# Patient Record
Sex: Female | Born: 1945
Health system: Southern US, Community
[De-identification: ages and names within clinical notes are randomized; demographics above are authoritative.]

## PROBLEM LIST (undated history)

## (undated) DIAGNOSIS — J301 Allergic rhinitis due to pollen: Secondary | ICD-10-CM

## (undated) DIAGNOSIS — M159 Polyosteoarthritis, unspecified: Secondary | ICD-10-CM

## (undated) DIAGNOSIS — I10 Essential (primary) hypertension: Secondary | ICD-10-CM

## (undated) DIAGNOSIS — Z8249 Family history of ischemic heart disease and other diseases of the circulatory system: Secondary | ICD-10-CM

## (undated) DIAGNOSIS — M818 Other osteoporosis without current pathological fracture: Secondary | ICD-10-CM

## (undated) DIAGNOSIS — T7840XA Allergy, unspecified, initial encounter: Secondary | ICD-10-CM

## (undated) DIAGNOSIS — Z825 Family history of asthma and other chronic lower respiratory diseases: Secondary | ICD-10-CM

## (undated) DIAGNOSIS — E785 Hyperlipidemia, unspecified: Secondary | ICD-10-CM

## (undated) DIAGNOSIS — C4491 Basal cell carcinoma of skin, unspecified: Secondary | ICD-10-CM

## (undated) HISTORY — DX: Other osteoporosis without current pathological fracture: M81.8

## (undated) HISTORY — DX: Essential (primary) hypertension: I10

## (undated) HISTORY — DX: Family history of ischemic heart disease and other diseases of the circulatory system: Z82.49

## (undated) HISTORY — DX: Hyperlipidemia, unspecified: E78.5

## (undated) HISTORY — PX: TUBAL LIGATION: SHX77

## (undated) HISTORY — PX: COLONOSCOPY: SHX174

## (undated) HISTORY — DX: Basal cell carcinoma of skin, unspecified: C44.91

## (undated) HISTORY — DX: Family history of asthma and other chronic lower respiratory diseases: Z82.5

## (undated) HISTORY — PX: POLYPECTOMY: SHX149

## (undated) HISTORY — PX: AUGMENTATION MAMMAPLASTY: SUR837

## (undated) HISTORY — DX: Allergic rhinitis due to pollen: J30.1

## (undated) HISTORY — DX: Allergy, unspecified, initial encounter: T78.40XA

## (undated) HISTORY — PX: FOOT SURGERY: SHX648

## (undated) HISTORY — DX: Polyosteoarthritis, unspecified: M15.9

---

## 1968-07-31 HISTORY — PX: TONSILLECTOMY: SUR1361

## 1971-08-01 HISTORY — PX: BREAST ENHANCEMENT SURGERY: SHX7

## 1992-07-31 DIAGNOSIS — C4491 Basal cell carcinoma of skin, unspecified: Secondary | ICD-10-CM

## 1992-07-31 HISTORY — DX: Basal cell carcinoma of skin, unspecified: C44.91

## 1998-05-28 ENCOUNTER — Other Ambulatory Visit: Admission: RE | Admit: 1998-05-28 | Discharge: 1998-05-28 | Payer: Self-pay | Admitting: Internal Medicine

## 2004-05-13 ENCOUNTER — Other Ambulatory Visit: Admission: RE | Admit: 2004-05-13 | Discharge: 2004-05-13 | Payer: Self-pay | Admitting: Internal Medicine

## 2006-05-18 ENCOUNTER — Other Ambulatory Visit: Admission: RE | Admit: 2006-05-18 | Discharge: 2006-05-18 | Payer: Self-pay | Admitting: Internal Medicine

## 2007-06-26 ENCOUNTER — Encounter: Admission: RE | Admit: 2007-06-26 | Discharge: 2007-06-26 | Payer: Self-pay | Admitting: Internal Medicine

## 2007-07-03 ENCOUNTER — Encounter: Admission: RE | Admit: 2007-07-03 | Discharge: 2007-07-03 | Payer: Self-pay | Admitting: Internal Medicine

## 2008-01-03 ENCOUNTER — Encounter: Admission: RE | Admit: 2008-01-03 | Discharge: 2008-01-03 | Payer: Self-pay | Admitting: Internal Medicine

## 2008-06-26 ENCOUNTER — Encounter: Admission: RE | Admit: 2008-06-26 | Discharge: 2008-06-26 | Payer: Self-pay | Admitting: Internal Medicine

## 2009-06-10 ENCOUNTER — Other Ambulatory Visit: Admission: RE | Admit: 2009-06-10 | Discharge: 2009-06-10 | Payer: Self-pay | Admitting: Internal Medicine

## 2009-06-25 ENCOUNTER — Encounter: Admission: RE | Admit: 2009-06-25 | Discharge: 2009-06-25 | Payer: Self-pay | Admitting: Internal Medicine

## 2010-06-24 ENCOUNTER — Encounter: Admission: RE | Admit: 2010-06-24 | Discharge: 2010-06-24 | Payer: Self-pay | Admitting: Internal Medicine

## 2010-09-01 ENCOUNTER — Ambulatory Visit
Admission: RE | Admit: 2010-09-01 | Discharge: 2010-09-01 | Disposition: A | Discharge status: Home / Self Care | Payer: BC Managed Care – PPO | Source: Ambulatory Visit | Attending: Internal Medicine | Admitting: Internal Medicine

## 2010-09-01 ENCOUNTER — Other Ambulatory Visit: Payer: Self-pay | Admitting: Internal Medicine

## 2010-09-01 DIAGNOSIS — R609 Edema, unspecified: Secondary | ICD-10-CM

## 2010-09-01 DIAGNOSIS — R52 Pain, unspecified: Secondary | ICD-10-CM

## 2011-06-14 ENCOUNTER — Other Ambulatory Visit: Payer: Self-pay | Admitting: Internal Medicine

## 2011-06-14 DIAGNOSIS — Z1231 Encounter for screening mammogram for malignant neoplasm of breast: Secondary | ICD-10-CM

## 2011-07-14 ENCOUNTER — Ambulatory Visit
Admission: RE | Admit: 2011-07-14 | Discharge: 2011-07-14 | Disposition: A | Discharge status: Home / Self Care | Payer: BC Managed Care – PPO | Source: Ambulatory Visit | Attending: Internal Medicine | Admitting: Internal Medicine

## 2011-07-14 DIAGNOSIS — Z1231 Encounter for screening mammogram for malignant neoplasm of breast: Secondary | ICD-10-CM

## 2011-11-06 ENCOUNTER — Other Ambulatory Visit (HOSPITAL_COMMUNITY)
Admission: RE | Admit: 2011-11-06 | Discharge: 2011-11-06 | Disposition: A | Discharge status: Home / Self Care | Payer: BC Managed Care – PPO | Source: Ambulatory Visit | Attending: Internal Medicine | Admitting: Internal Medicine

## 2011-11-06 ENCOUNTER — Other Ambulatory Visit: Payer: Self-pay | Admitting: *Deleted

## 2011-11-06 DIAGNOSIS — Z01419 Encounter for gynecological examination (general) (routine) without abnormal findings: Secondary | ICD-10-CM | POA: Insufficient documentation

## 2011-11-06 DIAGNOSIS — Z1159 Encounter for screening for other viral diseases: Secondary | ICD-10-CM | POA: Insufficient documentation

## 2012-06-10 ENCOUNTER — Other Ambulatory Visit: Payer: Self-pay | Admitting: Internal Medicine

## 2012-07-31 LAB — HM PAP SMEAR: HM PAP: NORMAL

## 2012-08-09 ENCOUNTER — Other Ambulatory Visit: Payer: Self-pay | Admitting: Internal Medicine

## 2012-08-09 DIAGNOSIS — Z1231 Encounter for screening mammogram for malignant neoplasm of breast: Secondary | ICD-10-CM

## 2012-08-21 ENCOUNTER — Ambulatory Visit: Payer: BC Managed Care – PPO | Admitting: Internal Medicine

## 2012-08-28 ENCOUNTER — Ambulatory Visit: Payer: BC Managed Care – PPO | Admitting: Internal Medicine

## 2012-09-04 ENCOUNTER — Ambulatory Visit
Admission: RE | Admit: 2012-09-04 | Discharge: 2012-09-04 | Disposition: A | Discharge status: Home / Self Care | Payer: BC Managed Care – PPO | Source: Ambulatory Visit | Attending: Internal Medicine | Admitting: Internal Medicine

## 2012-09-04 DIAGNOSIS — Z1231 Encounter for screening mammogram for malignant neoplasm of breast: Secondary | ICD-10-CM

## 2012-09-13 ENCOUNTER — Ambulatory Visit: Payer: BC Managed Care – PPO | Admitting: Internal Medicine

## 2012-10-21 ENCOUNTER — Ambulatory Visit (INDEPENDENT_AMBULATORY_CARE_PROVIDER_SITE_OTHER): Payer: BC Managed Care – PPO | Admitting: Internal Medicine

## 2012-10-21 ENCOUNTER — Encounter: Payer: Self-pay | Admitting: Internal Medicine

## 2012-10-21 VITALS — BP 118/70 | HR 98 | Temp 98.1°F | Ht 64.0 in | Wt 146.0 lb

## 2012-10-21 DIAGNOSIS — M159 Polyosteoarthritis, unspecified: Secondary | ICD-10-CM | POA: Insufficient documentation

## 2012-10-21 DIAGNOSIS — J301 Allergic rhinitis due to pollen: Secondary | ICD-10-CM | POA: Insufficient documentation

## 2012-10-21 DIAGNOSIS — M810 Age-related osteoporosis without current pathological fracture: Secondary | ICD-10-CM | POA: Insufficient documentation

## 2012-10-21 DIAGNOSIS — I10 Essential (primary) hypertension: Secondary | ICD-10-CM | POA: Insufficient documentation

## 2012-10-21 DIAGNOSIS — E785 Hyperlipidemia, unspecified: Secondary | ICD-10-CM | POA: Insufficient documentation

## 2012-10-21 DIAGNOSIS — M818 Other osteoporosis without current pathological fracture: Secondary | ICD-10-CM

## 2012-10-21 MED ORDER — MONTELUKAST SODIUM 10 MG PO TABS: 10.0000 mg | ORAL_TABLET | Freq: Every day | ORAL | Status: DC

## 2012-10-21 NOTE — Assessment & Plan Note (Signed)
BP Readings from Last 3 Encounters:  10/21/12 118/70   Good control Recent labs normal

## 2012-10-21 NOTE — Assessment & Plan Note (Signed)
Not sure about this Will continue just the vitamin D (has trouble with the calcium) Consider repeat DEXA in a year (last 2012)

## 2012-10-21 NOTE — Assessment & Plan Note (Signed)
Various spots Especially in feet though now better Uses aleve prn

## 2012-10-21 NOTE — Assessment & Plan Note (Signed)
Has had cough and wheezing Sounds like allergic asthma Will try montelukast

## 2012-10-21 NOTE — Assessment & Plan Note (Signed)
Last non HDL chol in 70's Will consider changing to plain simvastatin

## 2012-10-21 NOTE — Progress Notes (Signed)
Subjective:    Patient ID: Belinda Perry, female    DOB: 1946/03/07, 67 y.o.   MRN: [id number]  HPI Transferring care from [facility] I see her husband and she lives here  Had recent PE last month--including breast exam  High blood pressure started in her 40's or before Satisfied with her current medication No headaches Tries to walk regularly and has some gym equipment  High cholesterol for many years also Doing well on the med No myalgias  Had calcium deposit in toe Afraid to take all the calcium due to this Then got pus out of it Saw Dr Al Corpus about this--apparently had calcium deposits (but not clear whether this was heterotopic)  Diagnosed with early osteoporosis by bone density  Some degree of arthritis Back and hands and toe? Uses aleve prn  Allergies to pollen Had bad spell last March----couldn't get rid of cough Diagnosed with "walking pneumonia" then Zyrtec no help then. Allegra not clearly helpful either---has for prn  No current outpatient prescriptions on file prior to visit.   No current facility-administered medications on file prior to visit.    Allergies  Allergen Reactions  . Codeine Rash    Past Medical History  Diagnosis Date  . Hypertension   . Hyperlipidemia   . Other osteoporosis   . Allergic reaction to inhaled pollen   . Osteoarthritis, generalized     Past Surgical History  Procedure Laterality Date  . Foot surgery Bilateral     right 8/12, 11/12 for left. Dr Al Corpus  . Tubal ligation    . Tonsillectomy N/A 1970  . Breast enhancement surgery Bilateral 1973    Family History  Problem Relation Age of Onset  . Heart disease Mother   . Heart disease Father   . Diabetes Father   . Hypertension Father   . Stroke Sister   . Heart disease Brother   . Cancer Maternal Grandfather     colon  . Cancer Other     various cancers    History   Social History  . Marital Status: Married    Spouse Name: N/A    Number of Children: 1  .  Years of Education: N/A   Occupational History  . 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 Smoking status: Never Smoker   . Smokeless tobacco: Never Used  . Alcohol Use: No  . Drug Use: No  . Sexually Active: Not on file   Other Topics Concern  . Not on file   Social History Narrative   1 daughter   Review of Systems  Constitutional: Negative for fatigue and unexpected weight change.  HENT: Positive for dental problem. Negative for hearing loss.        Trouble with her crowns now  Eyes: Negative for redness and visual disturbance.  Respiratory: Positive for cough. Negative for chest tightness and shortness of breath.        Still some cough and wheezing--relates to allergies  Cardiovascular: Negative for chest pain, palpitations and leg swelling.  Gastrointestinal: Positive for constipation and abdominal distention. Negative for nausea and vomiting.       Rare heartburn  Bloats easily---plans to try colace  Genitourinary: Negative for dysuria, difficulty urinating and dyspareunia.  Musculoskeletal: Positive for back pain and arthralgias. Negative for joint swelling.  Skin: Negative for pallor and rash.  Allergic/Immunologic: Positive for environmental allergies. Negative for immunocompromised state.  Neurological: Negative for dizziness, syncope, light-headedness and headaches.  Hematological: Negative for adenopathy. Bruises/bleeds easily.  Psychiatric/Behavioral: Positive for sleep disturbance. Negative for dysphoric mood. The patient is not nervous/anxious.        Gets up frequently since foot surgery---eventually gets back to sleep. Occasionally naps No sig daytime somnolence      Objective:   Physical Exam  Constitutional: She appears well-developed and well-nourished. No distress.  HENT:  Mouth/Throat: Oropharynx is clear and moist. No oropharyngeal exudate.  Eyes: Conjunctivae and EOM are normal. Pupils are equal, round, and  reactive to light.  Neck: Normal range of motion. Neck supple. No thyromegaly present.  Cardiovascular: Normal rate, regular rhythm, normal heart sounds and intact distal pulses.  Exam reveals no gallop.   No murmur heard. Pulmonary/Chest: Effort normal and breath sounds normal. No respiratory distress. She has no wheezes. She has no rales.  Abdominal: Soft. There is no tenderness.  Musculoskeletal: She exhibits no edema and no tenderness.  Lymphadenopathy:    She has no cervical adenopathy.  Skin: No rash noted. No erythema.  Psychiatric: She has a normal mood and affect. Her behavior is normal.          Assessment & Plan:

## 2012-11-01 ENCOUNTER — Encounter: Payer: Self-pay | Admitting: Internal Medicine

## 2013-04-08 ENCOUNTER — Other Ambulatory Visit: Payer: Self-pay

## 2013-04-08 MED ORDER — EZETIMIBE-SIMVASTATIN 10-40 MG PO TABS: 1.0000 | ORAL_TABLET | Freq: Every day | ORAL | Status: DC

## 2013-04-08 MED ORDER — LISINOPRIL-HYDROCHLOROTHIAZIDE 20-12.5 MG PO TABS: 1.0000 | ORAL_TABLET | Freq: Every day | ORAL | Status: DC

## 2013-04-08 NOTE — Telephone Encounter (Signed)
rx sent to pharmacy by e-script Spoke with patient and advised results   

## 2013-04-08 NOTE — Telephone Encounter (Signed)
Pt has changed insurance co and pt request written rx for vytorin 10/40 mg taking one daily and Lisinopril 20/12.5 mg taking one daily. Pt has f/u appt scheduled 04/25/13 but will run out of med before appt. Call pt when rx ready for pick up.

## 2013-04-22 ENCOUNTER — Telehealth: Payer: Self-pay | Admitting: *Deleted

## 2013-04-22 NOTE — Telephone Encounter (Signed)
Form on your desk for prior auth for Vytorin 10-40 take 1 tablet by mouth at bedtime.

## 2013-04-23 NOTE — Telephone Encounter (Signed)
Pt states she has an appt on Friday and will discuss then

## 2013-04-23 NOTE — Telephone Encounter (Signed)
Please call her I don't recommend using the combination medication she has since the ezitimbe part has not been proven to reduce heart problems. The new national cholesterol guidelines do not recommend using any meds besides the statins.  I would recommend changing to only the simvastatin portion (40mg  daily) and this doesn't require prior authorizaton. See if she is willing to do this

## 2013-04-23 NOTE — Telephone Encounter (Signed)
That sounds good

## 2013-04-25 ENCOUNTER — Encounter: Payer: Self-pay | Admitting: Internal Medicine

## 2013-04-25 ENCOUNTER — Ambulatory Visit (INDEPENDENT_AMBULATORY_CARE_PROVIDER_SITE_OTHER): Payer: BC Managed Care – PPO | Admitting: Internal Medicine

## 2013-04-25 VITALS — BP 140/82 | HR 91 | Temp 97.4°F | Wt 147.0 lb

## 2013-04-25 DIAGNOSIS — I10 Essential (primary) hypertension: Secondary | ICD-10-CM

## 2013-04-25 DIAGNOSIS — E785 Hyperlipidemia, unspecified: Secondary | ICD-10-CM

## 2013-04-25 DIAGNOSIS — Z23 Encounter for immunization: Secondary | ICD-10-CM

## 2013-04-25 MED ORDER — LISINOPRIL-HYDROCHLOROTHIAZIDE 20-12.5 MG PO TABS: 1.0000 | ORAL_TABLET | Freq: Every day | ORAL | Status: DC

## 2013-04-25 MED ORDER — MONTELUKAST SODIUM 10 MG PO TABS: 10.0000 mg | ORAL_TABLET | Freq: Every day | ORAL | Status: DC

## 2013-04-25 MED ORDER — SIMVASTATIN 40 MG PO TABS: 40.0000 mg | ORAL_TABLET | Freq: Every day | ORAL | Status: DC

## 2013-04-25 NOTE — Assessment & Plan Note (Signed)
BP Readings from Last 3 Encounters:  04/25/13 140/82  10/21/12 118/70   Reasonable control No changes

## 2013-04-25 NOTE — Progress Notes (Signed)
Subjective:    Patient ID: Belinda Perry, female    DOB: Feb 15, 1946, 67 y.o.   MRN: [id number]  HPI Doing well No new concerns  Discussed the cholesterol Rx Will stop the ezetimibe as the evidence base doesn't support it  No headaches No chest pain No SOB  Some allergy symptoms Has been better since using the montelukast  Current Outpatient Prescriptions on File Prior to Visit  Medication Sig Dispense Refill  . aspirin 81 MG tablet Take 81 mg by mouth daily.      Marland Kitchen ezetimibe-simvastatin (VYTORIN) 10-40 MG per tablet Take 1 tablet by mouth at bedtime.  90 tablet  0  . lisinopril-hydrochlorothiazide (PRINZIDE,ZESTORETIC) 20-12.5 MG per tablet Take 1 tablet by mouth daily.  90 tablet  0  . montelukast (SINGULAIR) 10 MG tablet Take 1 tablet (10 mg total) by mouth at bedtime.  30 tablet  11  . Naproxen Sodium (ALEVE) 220 MG CAPS Take 1 capsule by mouth daily.      . OMEGA 3 1200 MG CAPS Take 2 capsules by mouth daily.      . sennosides-docusate sodium (SENOKOT-S) 8.6-50 MG tablet Take 2 tablets by mouth daily as needed for constipation.      . Vitamin D, Ergocalciferol, (DRISDOL) 50000 UNITS CAPS Take 50,000 Units by mouth every 30 (thirty) days.       No current facility-administered medications on file prior to visit.    Allergies  Allergen Reactions  . Codeine Rash    Past Medical History  Diagnosis Date  . Hypertension   . Hyperlipidemia   . Other osteoporosis   . Allergic reaction to inhaled pollen   . Osteoarthritis, generalized     Past Surgical History  Procedure Laterality Date  . Foot surgery Bilateral     right 8/12, 11/12 for left. Dr Al Corpus  . Tubal ligation    . Tonsillectomy N/A 1970  . Breast enhancement surgery Bilateral 1973    Family History  Problem Relation Age of Onset  . Heart disease Mother   . Heart disease Father   . Diabetes Father   . Hypertension Father   . Stroke Sister   . Heart disease Brother   . Cancer Maternal Grandfather     colon  . Cancer Other     various cancers    History   Social History  . Marital Status: Married    Spouse Name: N/A    Number of Children: 1  . Years of Education: N/A   Occupational History  . 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 Smoking status: Never Smoker   . Smokeless tobacco: Never Used  . Alcohol Use: No  . Drug Use: No  . Sexual Activity: Not on file   Other Topics Concern  . Not on file   Social History Narrative   1 daughter    Review of Systems Sleeps well Appetite is good Weight stable    Objective:   Physical Exam  Constitutional: She appears well-developed and well-nourished. No distress.  Neck: Normal range of motion. Neck supple. No thyromegaly present.  Cardiovascular: Normal rate, regular rhythm and normal heart sounds.  Exam reveals no gallop.   No murmur heard. Pulmonary/Chest: Effort normal and breath sounds normal. No respiratory distress. She has no wheezes. She has no rales.  Musculoskeletal: She exhibits no edema and no tenderness.  Lymphadenopathy:    She has no cervical adenopathy.  Psychiatric: She has a  normal mood and affect.          Assessment & Plan:

## 2013-04-25 NOTE — Assessment & Plan Note (Signed)
Will change to just the simvastatin

## 2013-04-28 NOTE — Addendum Note (Signed)
Addended by: Sueanne Margarita on: 04/28/2013 12:42 PM   Modules accepted: Orders

## 2013-05-21 ENCOUNTER — Other Ambulatory Visit: Payer: Self-pay | Admitting: *Deleted

## 2013-05-21 MED ORDER — SIMVASTATIN 40 MG PO TABS: 40.0000 mg | ORAL_TABLET | Freq: Every day | ORAL | Status: DC

## 2013-06-02 ENCOUNTER — Other Ambulatory Visit: Payer: Self-pay | Admitting: Internal Medicine

## 2013-06-17 ENCOUNTER — Other Ambulatory Visit: Payer: Self-pay | Admitting: Internal Medicine

## 2013-07-01 ENCOUNTER — Other Ambulatory Visit: Payer: Self-pay | Admitting: Internal Medicine

## 2013-07-15 ENCOUNTER — Other Ambulatory Visit: Payer: Self-pay | Admitting: Internal Medicine

## 2013-08-05 ENCOUNTER — Other Ambulatory Visit: Payer: Self-pay

## 2013-08-05 DIAGNOSIS — Z1231 Encounter for screening mammogram for malignant neoplasm of breast: Secondary | ICD-10-CM

## 2013-09-05 ENCOUNTER — Ambulatory Visit
Admission: RE | Admit: 2013-09-05 | Discharge: 2013-09-05 | Disposition: A | Discharge status: Home / Self Care | Payer: Medicare HMO | Source: Ambulatory Visit

## 2013-09-05 DIAGNOSIS — Z1231 Encounter for screening mammogram for malignant neoplasm of breast: Secondary | ICD-10-CM

## 2013-09-09 ENCOUNTER — Encounter: Payer: Self-pay | Admitting: Family Medicine

## 2013-11-19 ENCOUNTER — Telehealth: Payer: Self-pay | Admitting: Internal Medicine

## 2013-11-19 ENCOUNTER — Ambulatory Visit (INDEPENDENT_AMBULATORY_CARE_PROVIDER_SITE_OTHER): Payer: Medicare HMO | Admitting: Internal Medicine

## 2013-11-19 ENCOUNTER — Encounter: Payer: Self-pay | Admitting: Internal Medicine

## 2013-11-19 VITALS — BP 110/70 | HR 74 | Temp 98.5°F | Ht 64.0 in | Wt 152.0 lb

## 2013-11-19 DIAGNOSIS — I10 Essential (primary) hypertension: Secondary | ICD-10-CM

## 2013-11-19 DIAGNOSIS — Z23 Encounter for immunization: Secondary | ICD-10-CM

## 2013-11-19 DIAGNOSIS — Z1211 Encounter for screening for malignant neoplasm of colon: Secondary | ICD-10-CM

## 2013-11-19 DIAGNOSIS — Z0000 Encounter for general adult medical examination without abnormal findings: Secondary | ICD-10-CM | POA: Insufficient documentation

## 2013-11-19 DIAGNOSIS — E785 Hyperlipidemia, unspecified: Secondary | ICD-10-CM

## 2013-11-19 LAB — CBC WITH DIFFERENTIAL/PLATELET
BASOS PCT: 0.4 % (ref 0.0–3.0)
Basophils Absolute: 0 10*3/uL (ref 0.0–0.1)
EOS ABS: 0.1 10*3/uL (ref 0.0–0.7)
Eosinophils Relative: 0.6 % (ref 0.0–5.0)
HEMATOCRIT: 47.6 % — AB (ref 36.0–46.0)
Hemoglobin: 16 g/dL — ABNORMAL HIGH (ref 12.0–15.0)
LYMPHS ABS: 2.4 10*3/uL (ref 0.7–4.0)
Lymphocytes Relative: 23.1 % (ref 12.0–46.0)
MCHC: 33.6 g/dL (ref 30.0–36.0)
MCV: 96.2 fl (ref 78.0–100.0)
MONO ABS: 0.5 10*3/uL (ref 0.1–1.0)
Monocytes Relative: 4.6 % (ref 3.0–12.0)
Neutro Abs: 7.5 10*3/uL (ref 1.4–7.7)
Neutrophils Relative %: 71.3 % (ref 43.0–77.0)
PLATELETS: 272 10*3/uL (ref 150.0–400.0)
RBC: 4.95 Mil/uL (ref 3.87–5.11)
RDW: 13.9 % (ref 11.5–14.6)
WBC: 10.6 10*3/uL — AB (ref 4.5–10.5)

## 2013-11-19 LAB — COMPREHENSIVE METABOLIC PANEL
ALBUMIN: 3.9 g/dL (ref 3.5–5.2)
ALT: 19 U/L (ref 0–35)
AST: 21 U/L (ref 0–37)
Alkaline Phosphatase: 71 U/L (ref 39–117)
BUN: 14 mg/dL (ref 6–23)
CALCIUM: 10.7 mg/dL — AB (ref 8.4–10.5)
CHLORIDE: 102 meq/L (ref 96–112)
CO2: 30 meq/L (ref 19–32)
Creatinine, Ser: 0.9 mg/dL (ref 0.4–1.2)
GFR: 65.86 mL/min (ref >60.00)
Glucose, Bld: 93 mg/dL (ref 70–99)
POTASSIUM: 4.2 meq/L (ref 3.5–5.1)
Sodium: 139 mEq/L (ref 135–145)
Total Bilirubin: 1.1 mg/dL (ref 0.3–1.2)
Total Protein: 7.4 g/dL (ref 6.0–8.3)

## 2013-11-19 LAB — LIPID PANEL
Cholesterol: 155 mg/dL (ref 0–200)
HDL: 52.6 mg/dL (ref >39.00)
LDL CALC: 81 mg/dL (ref 0–99)
Total CHOL/HDL Ratio: 3
Triglycerides: 105 mg/dL (ref 0.0–149.0)
VLDL: 21 mg/dL (ref 0.0–40.0)

## 2013-11-19 LAB — TSH: TSH: 0.69 u[IU]/mL (ref 0.35–5.50)

## 2013-11-19 LAB — T4, FREE: Free T4: 0.81 ng/dL (ref 0.60–1.60)

## 2013-11-19 NOTE — Progress Notes (Signed)
Subjective:    Patient ID: Belinda Perry, female    DOB: Feb 03, 1946, 68 y.o.   MRN: [id number]  HPI Here for physical Husband here Reviewed advanced directives Form checked Vision and hearing are fine Does exercise No falls No depression or anhedonia Reviewed immunizations and cancer screening-- needs colonoscopy Last Pap about a year ago--always normal No cognitive changes  Continues on the cholesterol med No problems with this On BP meds as well Doing well  Current Outpatient Prescriptions on File Prior to Visit  Medication Sig Dispense Refill  . aspirin 81 MG tablet Take 81 mg by mouth daily.      Marland Kitchen lisinopril-hydrochlorothiazide (PRINZIDE,ZESTORETIC) 20-12.5 MG per tablet TAKE 1 TABLET BY MOUTH DAILY.  90 tablet  0  . montelukast (SINGULAIR) 10 MG tablet Take 1 tablet (10 mg total) by mouth at bedtime.  90 tablet  3  . Naproxen Sodium (ALEVE) 220 MG CAPS Take 1 capsule by mouth daily.      . OMEGA 3 1200 MG CAPS Take 2 capsules by mouth daily.      . sennosides-docusate sodium (SENOKOT-S) 8.6-50 MG tablet Take 2 tablets by mouth daily as needed for constipation.      . simvastatin (ZOCOR) 40 MG tablet TAKE 1 TABLET (40 MG TOTAL) BY MOUTH AT BEDTIME.  90 tablet  0  . Vitamin D, Ergocalciferol, (DRISDOL) 50000 UNITS CAPS Take 50,000 Units by mouth every 30 (thirty) days.       No current facility-administered medications on file prior to visit.    Allergies  Allergen Reactions  . Codeine Rash    Past Medical History  Diagnosis Date  . Hypertension   . Hyperlipidemia   . Other osteoporosis   . Allergic reaction to inhaled pollen   . Osteoarthritis, generalized     Past Surgical History  Procedure Laterality Date  . Foot surgery Bilateral     right 8/12, 11/12 for left. Dr Milinda Pointer  . Tubal ligation    . Tonsillectomy N/A 1970  . Breast enhancement surgery Bilateral 1973    Family History  Problem Relation Age of Onset  . Heart disease Mother   . Heart  disease Father   . Diabetes Father   . Hypertension Father   . Stroke Sister   . Heart disease Brother   . Cancer Maternal Grandfather     colon  . Cancer Other     various cancers    History   Social History  . Marital Status: Married    Spouse Name: N/A    Number of Children: 1  . Years of Education: N/A   Occupational History  . 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 Smoking status: Never Smoker   . Smokeless tobacco: Never Used  . Alcohol Use: No  . Drug Use: No  . Sexual Activity: Not on file   Other Topics Concern  . Not on file   Social History Narrative   1 daughter      No living will--about to do this   Husband should be health care POA   Would accept resuscitation attempts--no prolonged ventilation   Not sure about tube feedings   Review of Systems  Constitutional: Negative for fatigue and unexpected weight change.       Wears seat belt  HENT: Negative for dental problem, hearing loss and tinnitus.        Regular with dentist  Eyes: Negative for visual  disturbance.       Due for eye recheck---Lasik in past  Respiratory: Positive for cough. Negative for chest tightness and shortness of breath.        Some chronic cough--esp at night. No need to change lisinopril (may be drainage---but not too bad)  Cardiovascular: Negative for chest pain, palpitations and leg swelling.  Gastrointestinal: Negative for nausea, vomiting, abdominal pain, constipation and blood in stool.       No heartburn  Endocrine: Negative for cold intolerance and heat intolerance.  Genitourinary: Negative for dysuria, hematuria, difficulty urinating and dyspareunia.       Mild stress incontinence  Musculoskeletal: Positive for arthralgias. Negative for back pain and joint swelling.       Some CMC and occ foot pain--rarely uses aleve  Skin: Negative for rash.       No suspicious lesions  Allergic/Immunologic: Positive for environmental allergies.  Negative for immunocompromised state.       Using cetirizine and montelukast  Neurological: Negative for dizziness, syncope, weakness, light-headedness, numbness and headaches.  Hematological: Negative for adenopathy. Bruises/bleeds easily.       Objective:   Physical Exam  Constitutional: She is oriented to person, place, and time. She appears well-developed and well-nourished. No distress.  HENT:  Head: Normocephalic and atraumatic.  Right Ear: External ear normal.  Left Ear: External ear normal.  Mouth/Throat: Oropharynx is clear and moist. No oropharyngeal exudate.  Eyes: Conjunctivae and EOM are normal. Pupils are equal, round, and reactive to light.  Neck: Normal range of motion. Neck supple. No thyromegaly present.  Cardiovascular: Normal rate, regular rhythm, normal heart sounds and intact distal pulses.  Exam reveals no gallop.   No murmur heard. Pulmonary/Chest: Effort normal and breath sounds normal. No respiratory distress. She has no wheezes. She has no rales.  Abdominal: Soft. There is no tenderness.  Genitourinary:  Implants intact No masses in breasts  Musculoskeletal: She exhibits no edema and no tenderness.  Lymphadenopathy:    She has no cervical adenopathy.    She has no axillary adenopathy.  Neurological: She is alert and oriented to person, place, and time.  Skin: No rash noted. No erythema.  Psychiatric: She has a normal mood and affect. Her behavior is normal.          Assessment & Plan:

## 2013-11-19 NOTE — Assessment & Plan Note (Signed)
BP Readings from Last 3 Encounters:  11/19/13 110/70  04/25/13 140/82  10/21/12 118/70   Good control No changes needed

## 2013-11-19 NOTE — Progress Notes (Signed)
Pre visit review using our clinic review tool, if applicable. No additional management support is needed unless otherwise documented below in the visit note. 

## 2013-11-19 NOTE — Telephone Encounter (Signed)
Relevant patient education assigned to patient using Emmi. ° °

## 2013-11-19 NOTE — Assessment & Plan Note (Signed)
Healthy Will give pneumovax Colonoscopy this year

## 2013-11-19 NOTE — Assessment & Plan Note (Signed)
Comfortable with primary prevention 

## 2013-11-19 NOTE — Addendum Note (Signed)
Addended by: Despina Hidden on: 11/19/2013 03:03 PM   Modules accepted: Orders

## 2013-11-21 ENCOUNTER — Encounter: Payer: Self-pay | Admitting: *Deleted

## 2013-12-04 ENCOUNTER — Other Ambulatory Visit: Payer: Self-pay | Admitting: Internal Medicine

## 2013-12-25 ENCOUNTER — Ambulatory Visit (AMBULATORY_SURGERY_CENTER): Payer: Self-pay | Admitting: *Deleted

## 2013-12-25 VITALS — Ht 64.0 in | Wt 151.2 lb

## 2013-12-25 DIAGNOSIS — Z1211 Encounter for screening for malignant neoplasm of colon: Secondary | ICD-10-CM

## 2013-12-25 MED ORDER — MOVIPREP 100 G PO SOLR: ORAL | Status: DC

## 2013-12-25 NOTE — Progress Notes (Signed)
No allergies to eggs or soy. No problems with anesthesia.  Pt given Emmi instructions for colonoscopy  No oxygen use  No diet drug use  

## 2014-01-08 ENCOUNTER — Encounter: Payer: Self-pay | Admitting: Internal Medicine

## 2014-01-08 ENCOUNTER — Ambulatory Visit (AMBULATORY_SURGERY_CENTER): Payer: Medicare HMO | Admitting: Internal Medicine

## 2014-01-08 ENCOUNTER — Other Ambulatory Visit: Payer: Self-pay | Admitting: Internal Medicine

## 2014-01-08 VITALS — BP 121/75 | HR 73 | Temp 97.3°F | Resp 24 | Ht 64.0 in | Wt 151.0 lb

## 2014-01-08 DIAGNOSIS — D126 Benign neoplasm of colon, unspecified: Secondary | ICD-10-CM

## 2014-01-08 DIAGNOSIS — Z1211 Encounter for screening for malignant neoplasm of colon: Secondary | ICD-10-CM

## 2014-01-08 DIAGNOSIS — D128 Benign neoplasm of rectum: Secondary | ICD-10-CM

## 2014-01-08 DIAGNOSIS — D129 Benign neoplasm of anus and anal canal: Secondary | ICD-10-CM

## 2014-01-08 MED ORDER — SODIUM CHLORIDE 0.9 % IV SOLN: 500.0000 mL | INTRAVENOUS | Status: DC

## 2014-01-08 NOTE — Patient Instructions (Signed)
Discharge instructions given with verbal understanding. Handouts on polyps and diverticulosis. Hold aspirin and aspirin products for 1 week. Resume previous medications. YOU HAD AN ENDOSCOPIC PROCEDURE TODAY AT [facility]: Refer to the procedure report that was given to you for any specific questions about what was found during the examination.  If the procedure report does not answer your questions, please call your gastroenterologist to clarify.  If you requested that your care partner not be given the details of your procedure findings, then the procedure report has been included in a sealed envelope for you to review at your convenience later.  YOU SHOULD EXPECT: Some feelings of bloating in the abdomen. Passage of more gas than usual.  Walking can help get rid of the air that was put into your GI tract during the procedure and reduce the bloating. If you had a lower endoscopy (such as a colonoscopy or flexible sigmoidoscopy) you may notice spotting of blood in your stool or on the toilet paper. If you underwent a bowel prep for your procedure, then you may not have a normal bowel movement for a few days.  DIET: Your first meal following the procedure should be a light meal and then it is ok to progress to your normal diet.  A half-sandwich or bowl of soup is an example of a good first meal.  Heavy or fried foods are harder to digest and may make you feel nauseous or bloated.  Likewise meals heavy in dairy and vegetables can cause extra gas to form and this can also increase the bloating.  Drink plenty of fluids but you should avoid alcoholic beverages for 24 hours.  ACTIVITY: Your care partner should take you home directly after the procedure.  You should plan to take it easy, moving slowly for the rest of the day.  You can resume normal activity the day after the procedure however you should NOT DRIVE or use heavy machinery for 24 hours (because of the sedation medicines used during  the test).    SYMPTOMS TO REPORT IMMEDIATELY: A gastroenterologist can be reached at any hour.  During normal business hours, 8:30 AM to 5:00 PM Monday through Friday, call [phone].  After hours and on weekends, please call the GI answering service at [phone] who will take a message and have the physician on call contact you.   Following lower endoscopy (colonoscopy or flexible sigmoidoscopy):  Excessive amounts of blood in the stool  Significant tenderness or worsening of abdominal pains  Swelling of the abdomen that is new, acute  Fever of 100F or higher  FOLLOW UP: If any biopsies were taken you will be contacted by phone or by letter within the next 1-3 weeks.  Call your gastroenterologist if you have not heard about the biopsies in 3 weeks.  Our staff will call the home number listed on your records the next business day following your procedure to check on you and address any questions or concerns that you may have at that time regarding the information given to you following your procedure. This is a courtesy call and so if there is no answer at the home number and we have not heard from you through the emergency physician on call, we will assume that you have returned to your regular daily activities without incident.  SIGNATURES/CONFIDENTIALITY: You and/or your care partner have signed paperwork which will be entered into your electronic medical record.  These signatures attest to the fact that that  the information above on your After Visit Summary has been reviewed and is understood.  Full responsibility of the confidentiality of this discharge information lies with you and/or your care-partner.

## 2014-01-08 NOTE — Op Note (Signed)
[place]  [address]. [place], [address]   COLONOSCOPY PROCEDURE REPORT  PATIENT: [name], [name]  MR#: [id number] BIRTHDATE: 10-20-1945 , 54  yrs. old GENDER: Female ENDOSCOPIST: Jerene Bears, MD REFERRED AC:[id number] Silvio Pate, M.D. PROCEDURE DATE:  01/08/2014 PROCEDURE:   Colonoscopy with snare polypectomy First Screening Colonoscopy - Avg.  risk and is 50 yrs.  old or older Yes.  Prior Negative Screening - Now for repeat screening. N/A  History of Adenoma - Now for follow-up colonoscopy & has been > or = to 3 yrs.  N/A  Polyps Removed Today? Yes. ASA CLASS:   Class II INDICATIONS:average risk screening and first colonoscopy. MEDICATIONS: MAC sedation, administered by CRNA and propofol (Diprivan) 500mg  IV  DESCRIPTION OF PROCEDURE:   After the risks benefits and alternatives of the procedure were thoroughly explained, informed consent was obtained.  A digital rectal exam revealed no rectal mass.   The LB [id number]  endoscope was introduced through the anus and advanced to the cecum, which was identified by both the appendix and ileocecal valve. No adverse events experienced. The quality of the prep was good, using MoviPrep  The instrument was then slowly withdrawn as the colon was fully examined.   COLON FINDINGS: Two sessile polyps measuring 8 and 3 mm in size with mucous caps were found at the cecum and hepatic flexure. Polypectomy was performed using cold snare.  All resections were complete and all polyp tissue was completely retrieved.   Ten sessile polyps ranging between 5-31mm in size were found in the descending colon (1), sigmoid colon (7), and rectum (2). Polypectomy was performed using cold snare.  All resections were complete and all polyp tissue was completely retrieved.   There was moderate diverticulosis noted in the ascending colon, descending colon, and sigmoid colon with associated tortuosity and angulation. Retroflexed views  revealed no abnormalities. The time to cecum=6 minutes 07 seconds.  Withdrawal time=18 minutes 18 seconds.  The scope was withdrawn and the procedure completed. COMPLICATIONS: There were no complications.  ENDOSCOPIC IMPRESSION: 1.   Two sessile polyps measuring 8 and 3 mm in size were found at the cecum and hepatic flexure; Polypectomy was performed using cold snare 2.   Ten sessile polyps ranging between 5-41mm in size were found in the descending colon, sigmoid colon, and rectum; Polypectomy was performed using cold snare 3.   There was moderate diverticulosis noted in the ascending colon, descending colon, and sigmoid colon  RECOMMENDATIONS: 1.  Hold aspirin, aspirin products, and anti-inflammatory medication for 1 week. 2.  Await pathology results 3.  High fiber diet 4.  Timing of repeat colonoscopy will be determined by pathology findings. 5.  You will receive a letter within 1-2 weeks with the results of your biopsy as well as final recommendations.  Please call my office if you have not received a letter after 3 weeks.   eSigned:  Jerene Bears, MD 01/08/2014 9:20 AM   cc: The Patient and Venia Carbon, MD   PATIENT NAME:  Belinda Perry, Belinda Perry MR#: [id number]

## 2014-01-08 NOTE — Progress Notes (Signed)
Called to room to assist during endoscopic procedure.  Patient ID and intended procedure confirmed with present staff. Received instructions for my participation in the procedure from the performing physician.  

## 2014-01-08 NOTE — Progress Notes (Signed)
Stable to RR 

## 2014-01-09 ENCOUNTER — Telehealth: Payer: Self-pay | Admitting: *Deleted

## 2014-01-09 NOTE — Telephone Encounter (Signed)
  Follow up Call-  No answer, left message to call if questions or concerns.    

## 2014-01-13 ENCOUNTER — Encounter: Payer: Self-pay | Admitting: Internal Medicine

## 2014-03-04 ENCOUNTER — Other Ambulatory Visit: Payer: Self-pay | Admitting: Internal Medicine

## 2014-04-07 ENCOUNTER — Other Ambulatory Visit: Payer: Self-pay | Admitting: Family Medicine

## 2014-04-07 MED ORDER — SIMVASTATIN 40 MG PO TABS
ORAL_TABLET | ORAL | Status: DC
Start: 2014-04-07 — End: 2014-12-30

## 2014-04-07 MED ORDER — MONTELUKAST SODIUM 10 MG PO TABS: ORAL_TABLET | ORAL | Status: DC

## 2014-06-03 ENCOUNTER — Ambulatory Visit (INDEPENDENT_AMBULATORY_CARE_PROVIDER_SITE_OTHER): Payer: Medicare HMO | Admitting: *Deleted

## 2014-06-03 ENCOUNTER — Ambulatory Visit: Payer: Medicare HMO

## 2014-06-03 DIAGNOSIS — Z23 Encounter for immunization: Secondary | ICD-10-CM

## 2014-06-08 ENCOUNTER — Other Ambulatory Visit: Payer: Self-pay | Admitting: Internal Medicine

## 2014-09-15 ENCOUNTER — Telehealth: Payer: Self-pay | Admitting: *Deleted

## 2014-09-15 NOTE — Telephone Encounter (Signed)
Patient notified as instructed by telephone and verbalized understanding . Was advised by patient that she was talking about allergies. Patient stated that she took Zyrtec and it did not help, but will do what Dr. Silvio Pate recommends.

## 2014-09-15 NOTE — Telephone Encounter (Signed)
Patient left a voicemail stating that she is getting ready to go on a cruise and wants to know if there any medication or injection she can take to keep from getting sick?  Patient stated that she has gotten sick previously with her allergies when taking an [place] cruise even with taking her Zyrtec and other medications. Patient request any recommendations.

## 2014-09-15 NOTE — Telephone Encounter (Signed)
If she means sea-sickness-- I recommend meclizine 25mg  three times daily till she gets used to the ship. If she means a respiratory illness, I don't have any great ideas other than meticulous repetitive hand washing.

## 2014-10-19 ENCOUNTER — Ambulatory Visit (INDEPENDENT_AMBULATORY_CARE_PROVIDER_SITE_OTHER): Payer: Medicare HMO | Admitting: Internal Medicine

## 2014-10-19 ENCOUNTER — Encounter: Payer: Self-pay | Admitting: Internal Medicine

## 2014-10-19 VITALS — BP 120/80 | HR 101 | Temp 98.0°F | Resp 16 | Wt 147.0 lb

## 2014-10-19 DIAGNOSIS — K529 Noninfective gastroenteritis and colitis, unspecified: Secondary | ICD-10-CM

## 2014-10-19 NOTE — Progress Notes (Signed)
Pre visit review using our clinic review tool, if applicable. No additional management support is needed unless otherwise documented below in the visit note. 

## 2014-10-19 NOTE — Progress Notes (Signed)
Subjective:    Patient ID: Belinda Perry, female    DOB: [birth year], 69 y.o.   MRN: [id number]  HPI Having GI problems Went on cruise and started with cough in February Had GI bug there also---was put on isolation Given "pill" for diarrhea--seemed to help some Also pneumonia on the ship also but she didn't have this Got back home slightly over 2 weeks ago  Still has problems with soda--doesn't taste right Eating very little--- ice cream or yogurt Gets indigestion post prandial--with nausea but no vomiting No diarrhea  Flu type symptoms  Aching all over, gets weak and some chills Low grade fever with episode of diarrhea 4 days ago  Has not taken any other meds for illness Using mucinex and flonase for the allergies (and montelukast)  Current Outpatient Prescriptions on File Prior to Visit  Medication Sig Dispense Refill  . aspirin 81 MG tablet Take 81 mg by mouth daily.    Marland Kitchen BIOTIN PO Take by mouth daily.    . calcium carbonate (OS-CAL) 600 MG TABS tablet Take 600 mg by mouth daily.    Marland Kitchen lisinopril-hydrochlorothiazide (PRINZIDE,ZESTORETIC) 20-12.5 MG per tablet TAKE 1 TABLET BY MOUTH DAILY. 90 tablet 1  . montelukast (SINGULAIR) 10 MG tablet TAKE 1 TABLET (10 MG TOTAL) BY MOUTH AT BEDTIME. 30 tablet 5  . Naproxen Sodium (ALEVE) 220 MG CAPS Take 1 capsule by mouth daily.    . OMEGA 3 1200 MG CAPS Take 2 capsules by mouth daily.    . sennosides-docusate sodium (SENOKOT-S) 8.6-50 MG tablet Take 2 tablets by mouth daily as needed for constipation.    . simvastatin (ZOCOR) 40 MG tablet TAKE 1 TABLET (40 MG TOTAL) BY MOUTH AT BEDTIME. 90 tablet 1   No current facility-administered medications on file prior to visit.    Allergies  Allergen Reactions  . Codeine     "makes me feel hyper"    Past Medical History  Diagnosis Date  . Hypertension   . Hyperlipidemia   . Other osteoporosis   . Allergic reaction to inhaled pollen   . Osteoarthritis, generalized   . Allergy   .  Skin cancer, basal cell 1994    face    Past Surgical History  Procedure Laterality Date  . Foot surgery Bilateral     right 8/12, 11/12 for left. Dr Milinda Pointer  . Tubal ligation    . Tonsillectomy N/A 1970  . Breast enhancement surgery Bilateral 1973    Family History  Problem Relation Age of Onset  . Heart disease Mother   . Heart disease Father   . Diabetes Father   . Hypertension Father   . Stroke Sister   . Heart disease Brother   . Cancer Maternal Grandfather     colon  . Colon cancer Maternal Grandfather 98  . Cancer Other     various cancers  . Colon cancer Paternal Uncle 6    History   Social History  . Marital Status: Married    Spouse Name: N/A  . Number of Children: 1  . Years of Education: N/A   Occupational History  . 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 Smoking status: Never Smoker   . Smokeless tobacco: Never Used  . Alcohol Use: No  . Drug Use: No  . Sexual Activity: Not on file   Other Topics Concern  . Not on file   Social History Narrative   1 daughter  No living will--about to do this   Husband should be health care POA   Would accept resuscitation attempts--no prolonged ventilation   Not sure about tube feedings   Review of Systems Has lost a few pounds since last visit No SOB No regular cough    Objective:   Physical Exam  Constitutional: She appears well-developed and well-nourished. No distress.  HENT:  Mouth/Throat: Oropharynx is clear and moist. No oropharyngeal exudate.  Neck: Normal range of motion. Neck supple. No thyromegaly present.  Pulmonary/Chest: Effort normal and breath sounds normal. No respiratory distress. She has no wheezes. She has no rales.  Abdominal: Soft. Bowel sounds are normal. She exhibits no distension. There is no tenderness. There is no rebound and no guarding.  Musculoskeletal: She exhibits no edema.  Lymphadenopathy:    She has no cervical adenopathy.           Assessment & Plan:

## 2014-10-19 NOTE — Patient Instructions (Signed)
Please try activia yogurt &/or align probiotic tabs---to help your system get back to normal.

## 2014-10-19 NOTE — Assessment & Plan Note (Signed)
Seems to have resolved but not back to normal No respiratory findings Reassured  Try probiotic

## 2014-10-31 ENCOUNTER — Other Ambulatory Visit: Payer: Self-pay | Admitting: Internal Medicine

## 2014-11-23 ENCOUNTER — Ambulatory Visit (INDEPENDENT_AMBULATORY_CARE_PROVIDER_SITE_OTHER): Payer: Medicare HMO | Admitting: Internal Medicine

## 2014-11-23 ENCOUNTER — Encounter: Payer: Self-pay | Admitting: Internal Medicine

## 2014-11-23 VITALS — BP 128/80 | HR 51 | Temp 97.5°F | Ht 64.0 in | Wt 148.0 lb

## 2014-11-23 DIAGNOSIS — I10 Essential (primary) hypertension: Secondary | ICD-10-CM

## 2014-11-23 DIAGNOSIS — E785 Hyperlipidemia, unspecified: Secondary | ICD-10-CM | POA: Diagnosis not present

## 2014-11-23 DIAGNOSIS — M810 Age-related osteoporosis without current pathological fracture: Secondary | ICD-10-CM

## 2014-11-23 DIAGNOSIS — Z0000 Encounter for general adult medical examination without abnormal findings: Secondary | ICD-10-CM

## 2014-11-23 DIAGNOSIS — J301 Allergic rhinitis due to pollen: Secondary | ICD-10-CM | POA: Diagnosis not present

## 2014-11-23 DIAGNOSIS — Z23 Encounter for immunization: Secondary | ICD-10-CM | POA: Diagnosis not present

## 2014-11-23 DIAGNOSIS — Z7189 Other specified counseling: Secondary | ICD-10-CM

## 2014-11-23 MED ORDER — LISINOPRIL-HYDROCHLOROTHIAZIDE 20-12.5 MG PO TABS: 1.0000 | ORAL_TABLET | Freq: Every day | ORAL | Status: DC

## 2014-11-23 NOTE — Assessment & Plan Note (Signed)
No problems with statin--will continue primary prevention

## 2014-11-23 NOTE — Assessment & Plan Note (Signed)
See social history 

## 2014-11-23 NOTE — Assessment & Plan Note (Signed)
Regular exercise Will continue the calcium and vitamin D unless her calcium up again

## 2014-11-23 NOTE — Progress Notes (Signed)
Subjective:    Patient ID: Belinda Perry, female    DOB: [birth year], 69 y.o.   MRN: [id number]  HPI Here for Medicare wellness and follow up of chronic medical conditions Reviewed advanced directives No other doctors--other than opto (was [place]). Dr Benard Halsted to exercise--weights and walking No hospitalizations or procedures in past year No tobacco Occasional alcohol Vision and hearing okay Independent with instrumental ADLs No apparent cognitive decline Reviewed her  No falls No depression or anhedonia  Really enjoying retirement Likes to cruise  Ongoing allergy symptoms--spring is bad Milder yearlong problems Cetirizine, flonase and montelukast  No chest pain No SOB No dizziness or syncope No edema No headaches  Still on statin No myalgias No GI problems  Regular weight bearing exercise On calcium and vitamin D  Current Outpatient Prescriptions on File Prior to Visit  Medication Sig Dispense Refill  . aspirin 81 MG tablet Take 81 mg by mouth daily.    Marland Kitchen BIOTIN PO Take by mouth daily.    . montelukast (SINGULAIR) 10 MG tablet TAKE 1 TABLET (10 MG TOTAL) BY MOUTH AT BEDTIME. 30 tablet 5  . OMEGA 3 1200 MG CAPS Take 2 capsules by mouth daily.    . simvastatin (ZOCOR) 40 MG tablet TAKE 1 TABLET (40 MG TOTAL) BY MOUTH AT BEDTIME. 90 tablet 1   No current facility-administered medications on file prior to visit.    Allergies  Allergen Reactions  . Codeine     "makes me feel hyper"    Past Medical History  Diagnosis Date  . Hypertension   . Hyperlipidemia   . Other osteoporosis   . Allergic reaction to inhaled pollen   . Osteoarthritis, generalized   . Allergy   . Skin cancer, basal cell 1994    face    Past Surgical History  Procedure Laterality Date  . Foot surgery Bilateral     right 8/12, 11/12 for left. Dr Milinda Pointer  . Tubal ligation    . Tonsillectomy N/A 1970  . Breast enhancement surgery Bilateral 1973    Family History    Problem Relation Age of Onset  . Heart disease Mother   . Heart disease Father   . Diabetes Father   . Hypertension Father   . Stroke Sister   . Heart disease Brother   . Cancer Maternal Grandfather     colon  . Colon cancer Maternal Grandfather 86  . Cancer Other     various cancers  . Colon cancer Paternal Uncle 100    History   Social History  . Marital Status: Married    Spouse Name: N/A  . Number of Children: 1  . Years of Education: N/A   Occupational History  . 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 Smoking status: Never Smoker   . Smokeless tobacco: Never Used  . Alcohol Use: No  . Drug Use: No  . Sexual Activity: Not on file   Other Topics Concern  . Not on file   Social History Narrative   1 daughter      No living will   Husband should be health care POA   Would accept resuscitation attempts--no prolonged ventilation   Not sure about tube feedings--but wouldn't want this prolonged   Review of Systems Bowels are good on the probiotic Emmaline Kluver) Appetite is good Weight did go up on vacation--has lost some back Sleeps well No sig joint pain. Back gets stiff  and tight if she sits a long time. Past tennis elbow and bursitis on right. Occasionally uses aleve    Objective:   Physical Exam  Constitutional: She is oriented to person, place, and time. She appears well-developed and well-nourished. No distress.  HENT:  Mouth/Throat: Oropharynx is clear and moist. No oropharyngeal exudate.  Neck: Normal range of motion. Neck supple. No thyromegaly present.  Cardiovascular: Normal rate, regular rhythm, normal heart sounds and intact distal pulses.  Exam reveals no gallop.   No murmur heard. Pulmonary/Chest: Effort normal and breath sounds normal. No respiratory distress. She has no wheezes. She has no rales.  Abdominal: Soft. There is no tenderness.  Musculoskeletal: She exhibits no edema or tenderness.  Lymphadenopathy:     She has no cervical adenopathy.  Neurological: She is alert and oriented to person, place, and time.  President-- "Obama, Bush, Clinton" [phone] D-l-r-o-w Recall 3/3  Skin: No rash noted. No erythema.  Psychiatric: She has a normal mood and affect. Her behavior is normal.          Assessment & Plan:

## 2014-11-23 NOTE — Addendum Note (Signed)
Addended by: Despina Hidden on: 11/23/2014 11:32 AM   Modules accepted: Orders

## 2014-11-23 NOTE — Patient Instructions (Signed)
Your mammogram is due 2/17

## 2014-11-23 NOTE — Assessment & Plan Note (Signed)
I have personally reviewed the Medicare Annual Wellness questionnaire and have noted 1. The patient's medical and social history 2. Their use of alcohol, tobacco or illicit drugs 3. Their current medications and supplements 4. The patient's functional ability including ADL's, fall risks, home safety risks and hearing or visual             impairment. 5. Diet and physical activities 6. Evidence for depression or mood disorders  The patients weight, height, BMI and visual acuity have been recorded in the chart I have made referrals, counseling and provided education to the patient based review of the above and I have provided the pt with a written personalized care plan for preventive services.  I have provided you with a copy of your personalized plan for preventive services. Please take the time to review along with your updated medication list.  Will give prevnar today Td today also (no Tdap due to price) Mammogram due 2/17--she preferred no breast exam Colonoscopy due 2020

## 2014-11-23 NOTE — Assessment & Plan Note (Signed)
Satisfied with current Rx

## 2014-11-23 NOTE — Progress Notes (Signed)
Pre visit review using our clinic review tool, if applicable. No additional management support is needed unless otherwise documented below in the visit note. 

## 2014-11-23 NOTE — Assessment & Plan Note (Signed)
BP Readings from Last 3 Encounters:  11/23/14 128/80  10/19/14 120/80  01/08/14 121/75   Good control No changes due for labs

## 2014-11-25 ENCOUNTER — Other Ambulatory Visit (INDEPENDENT_AMBULATORY_CARE_PROVIDER_SITE_OTHER): Payer: Medicare HMO

## 2014-11-25 LAB — CBC WITH DIFFERENTIAL/PLATELET
BASOS ABS: 0 10*3/uL (ref 0.0–0.1)
Basophils Relative: 0.5 % (ref 0.0–3.0)
Eosinophils Absolute: 0.1 10*3/uL (ref 0.0–0.7)
Eosinophils Relative: 1.2 % (ref 0.0–5.0)
HCT: 46.2 % — ABNORMAL HIGH (ref 36.0–46.0)
Hemoglobin: 15.5 g/dL — ABNORMAL HIGH (ref 12.0–15.0)
Lymphocytes Relative: 25.6 % (ref 12.0–46.0)
Lymphs Abs: 1.9 10*3/uL (ref 0.7–4.0)
MCHC: 33.7 g/dL (ref 30.0–36.0)
MCV: 94.4 fl (ref 78.0–100.0)
MONOS PCT: 9.8 % (ref 3.0–12.0)
Monocytes Absolute: 0.7 10*3/uL (ref 0.1–1.0)
NEUTROS PCT: 62.9 % (ref 43.0–77.0)
Neutro Abs: 4.7 10*3/uL (ref 1.4–7.7)
PLATELETS: 278 10*3/uL (ref 150.0–400.0)
RBC: 4.89 Mil/uL (ref 3.87–5.11)
RDW: 14.8 % (ref 11.5–15.5)
WBC: 7.6 10*3/uL (ref 4.0–10.5)

## 2014-11-25 LAB — LIPID PANEL
Cholesterol: 163 mg/dL (ref 0–200)
HDL: 48.1 mg/dL (ref >39.00)
LDL Cholesterol: 96 mg/dL (ref 0–99)
NONHDL: 114.9
Total CHOL/HDL Ratio: 3
Triglycerides: 94 mg/dL (ref 0.0–149.0)
VLDL: 18.8 mg/dL (ref 0.0–40.0)

## 2014-11-25 LAB — COMPREHENSIVE METABOLIC PANEL
ALT: 23 U/L (ref 0–35)
AST: 21 U/L (ref 0–37)
Albumin: 4.2 g/dL (ref 3.5–5.2)
Alkaline Phosphatase: 91 U/L (ref 39–117)
BILIRUBIN TOTAL: 0.9 mg/dL (ref 0.2–1.2)
BUN: 16 mg/dL (ref 6–23)
CO2: 29 meq/L (ref 19–32)
CREATININE: 0.89 mg/dL (ref 0.40–1.20)
Calcium: 10 mg/dL (ref 8.4–10.5)
Chloride: 102 mEq/L (ref 96–112)
GFR: 67.36 mL/min (ref >60.00)
Glucose, Bld: 90 mg/dL (ref 70–99)
Potassium: 4 mEq/L (ref 3.5–5.1)
Sodium: 137 mEq/L (ref 135–145)
Total Protein: 7.4 g/dL (ref 6.0–8.3)

## 2014-11-25 LAB — T4, FREE: Free T4: 0.65 ng/dL (ref 0.60–1.60)

## 2014-11-27 ENCOUNTER — Other Ambulatory Visit: Payer: Self-pay | Admitting: Internal Medicine

## 2014-11-27 ENCOUNTER — Encounter: Payer: Self-pay | Admitting: *Deleted

## 2014-12-30 ENCOUNTER — Other Ambulatory Visit: Payer: Self-pay | Admitting: Internal Medicine

## 2015-01-05 ENCOUNTER — Telehealth: Payer: Self-pay | Admitting: Internal Medicine

## 2015-01-05 NOTE — Telephone Encounter (Signed)
Patient said Karena Addison told her to call her if she has any problems.  Patient has a cough,runny eyes,congestion,swollen glands,on and off fever.  Patient wants to know if Karena Addison can tell her what to do.  Patient said she's been going to the pharmacy the last two weeks and they have been suggesting medicine for her to take, but none of the medicines have worked.

## 2015-01-05 NOTE — Telephone Encounter (Signed)
I have not talked to patient, Dr. Silvio Pate should pt make an appt? Or what suggestions

## 2015-01-05 NOTE — Telephone Encounter (Signed)
Spoke with patient and advised results appt scheduled 

## 2015-01-05 NOTE — Telephone Encounter (Signed)
Sounds like she needs appt I still have appt tomorrow but not today---could set up with someone else

## 2015-01-06 ENCOUNTER — Encounter: Payer: Self-pay | Admitting: Internal Medicine

## 2015-01-06 ENCOUNTER — Ambulatory Visit (INDEPENDENT_AMBULATORY_CARE_PROVIDER_SITE_OTHER): Payer: Medicare HMO | Admitting: Internal Medicine

## 2015-01-06 VITALS — BP 120/80 | HR 97 | Temp 97.5°F | Wt 148.0 lb

## 2015-01-06 DIAGNOSIS — J0100 Acute maxillary sinusitis, unspecified: Secondary | ICD-10-CM | POA: Diagnosis not present

## 2015-01-06 MED ORDER — AMOXICILLIN 500 MG PO TABS: 1000.0000 mg | ORAL_TABLET | Freq: Two times a day (BID) | ORAL | Status: DC

## 2015-01-06 NOTE — Progress Notes (Signed)
Pre visit review using our clinic review tool, if applicable. No additional management support is needed unless otherwise documented below in the visit note. 

## 2015-01-06 NOTE — Assessment & Plan Note (Signed)
Drainage, PND, up into eyes Still could be viral Discussed supportive care---analgesics and flonase Start amoxil if worsening

## 2015-01-06 NOTE — Progress Notes (Signed)
Subjective:    Patient ID: Belinda Perry, female    DOB: 21-May-1946, 69 y.o.   MRN: [id number]  HPI Here due to respiratory symptoms  Went on cruise and started some coughing Wasn't sick then though Then started feeling bad about 8-9 days ago Fever, sore throat, worsened coughing Fatigue--had to lie down to rest after minimal exertion Left neck swelling  Lots of nasal drainage and comes up with cough No ear pain No SOB  Told to take flonase and mucinex from pharmacy--then had a lot of eye drainage Told to change to zyrtec Trying cough med also  Current Outpatient Prescriptions on File Prior to Visit  Medication Sig Dispense Refill  . aspirin 81 MG tablet Take 81 mg by mouth daily.    . Calcium Carb-Cholecalciferol 600-200 MG-UNIT TABS Take 1 tablet by mouth daily.    . cetirizine (ZYRTEC) 10 MG tablet Take 10 mg by mouth daily as needed for allergies.    . fluticasone (FLONASE) 50 MCG/ACT nasal spray Place 2 sprays into both nostrils daily as needed for allergies or rhinitis.    Belinda Perry Kitchen lisinopril-hydrochlorothiazide (PRINZIDE,ZESTORETIC) 20-12.5 MG per tablet TAKE 1 TABLET BY MOUTH EVERY DAY 90 tablet 1  . Multiple Vitamins-Minerals (MULTIVITAL) CHEW Chew 1 tablet by mouth daily.    . OMEGA 3 1200 MG CAPS Take 2 capsules by mouth daily.    . simvastatin (ZOCOR) 40 MG tablet TAKE 1 TABLET (40 MG TOTAL) BY MOUTH AT BEDTIME. 90 tablet 3   No current facility-administered medications on file prior to visit.    Allergies  Allergen Reactions  . Codeine     "makes me feel hyper"    Past Medical History  Diagnosis Date  . Hypertension   . Hyperlipidemia   . Other osteoporosis   . Allergic reaction to inhaled pollen   . Osteoarthritis, generalized   . Allergy   . Skin cancer, basal cell 1994    face    Past Surgical History  Procedure Laterality Date  . Foot surgery Bilateral     right 8/12, 11/12 for left. Dr Milinda Pointer  . Tubal ligation    . Tonsillectomy N/A 1970  .  Breast enhancement surgery Bilateral 1973    Family History  Problem Relation Age of Onset  . Heart disease Mother   . Heart disease Father   . Diabetes Father   . Hypertension Father   . Stroke Sister   . Heart disease Brother   . Cancer Maternal Grandfather     colon  . Colon cancer Maternal Grandfather 80  . Cancer Other     various cancers  . Colon cancer Paternal Uncle 96    History   Social History  . Marital Status: Married    Spouse Name: N/A  . Number of Children: 1  . Years of Education: N/A   Occupational History  . 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 Smoking status: Never Smoker   . Smokeless tobacco: Never Used  . Alcohol Use: No  . Drug Use: No  . Sexual Activity: Not on file   Other Topics Concern  . Not on file   Social History Narrative   1 daughter      No living will   Husband should be health care POA   Would accept resuscitation attempts--no prolonged ventilation   Not sure about tube feedings--but wouldn't want this prolonged   Review of Systems  No rash  No vomiting or diarrhea Appetite is okay     Objective:   Physical Exam  Constitutional: She appears well-developed and well-nourished. No distress.  HENT:  Mouth/Throat: Oropharynx is clear and moist. No oropharyngeal exudate.  No sinus tenderness Moderate nasal swelling  TMs normal  Neck: Normal range of motion. Neck supple. No thyromegaly present.  Pulmonary/Chest: Effort normal and breath sounds normal. No respiratory distress. She has no wheezes. She has no rales.  Lymphadenopathy:    She has no cervical adenopathy.  Skin: No rash noted.          Assessment & Plan:

## 2015-01-06 NOTE — Patient Instructions (Signed)
Only start the antibiotic if you are getting worse over the next few days.

## 2015-05-13 ENCOUNTER — Ambulatory Visit (INDEPENDENT_AMBULATORY_CARE_PROVIDER_SITE_OTHER): Payer: Medicare HMO

## 2015-05-13 DIAGNOSIS — Z23 Encounter for immunization: Secondary | ICD-10-CM | POA: Diagnosis not present

## 2015-09-13 ENCOUNTER — Other Ambulatory Visit: Payer: Self-pay | Admitting: Internal Medicine

## 2015-10-26 ENCOUNTER — Telehealth: Payer: Self-pay | Admitting: Internal Medicine

## 2015-10-26 NOTE — Telephone Encounter (Signed)
Pt said in last 24 hours has vomited x 2-3 times; pt said now more like spit up instead of vomiting; loose stools x 2 in last 24 hours and last loose stool was last night. Pt feels bloated and feels nauseated. pts allergies are causing drainage at back of throat. No fever now.Pt is leaving for a cruise on 10/31/2015. Pt does not want to schedule appt but does want advise of what to take to settle stomach down before going on cruise. [facility]. Pt request cb.

## 2015-10-26 NOTE — Telephone Encounter (Signed)
Spoke to patient

## 2015-10-26 NOTE — Telephone Encounter (Signed)
Patient Name: Belinda Perry  DOB: [birth year]    Initial Comment caller states she has vomiting, diarrhea; has urinated   Nurse Assessment  Nurse: Leilani Merl, RN, Heather Date/Time (Eastern Time): 10/26/2015 9:03:45 AM  Confirm and document reason for call. If symptomatic, describe symptoms. You must click the next button to save text entered. ---caller states that she started with vomiting and diarrhea on Saturday, but got worse yesterday. She is bloated  Has the patient traveled out of the country within the last 30 days? ---Not Applicable  Does the patient have any new or worsening symptoms? ---Yes  Will a triage be completed? ---Yes  Related visit to physician within the last 2 weeks? ---No  Does the PT have any chronic conditions? (i.e. diabetes, asthma, etc.) ---Yes  List chronic conditions. ---see MR  Is this a behavioral health or substance abuse call? ---No     Guidelines    Guideline Title Affirmed Question Affirmed Notes  Vomiting [1] MILD to MODERATE vomiting (e.g., 1-5 times/day) AND [2] abdomen looks much more swollen than usual    Final Disposition User   See Physician within 4 Hours (or PCP triage) Standifer, RN, Nira Conn    Comments  No appts available today, caller does not want to go to the ED or [facility], please call her and let her know what she needs to do.   Referrals  REFERRED TO PCP OFFICE   Disagree/Comply: Comply

## 2015-10-26 NOTE — Telephone Encounter (Signed)
If this is just a viral infection, it should pass over the next couple of days. Drink plenty of fluids and only eat very small amounts of bland food as tolerated. If she has ongoing symptoms later in the week, she really should be seen. They generally don't allow people with GI illness to go on cruise ships

## 2015-12-08 ENCOUNTER — Encounter: Payer: Self-pay | Admitting: Internal Medicine

## 2015-12-08 ENCOUNTER — Ambulatory Visit (INDEPENDENT_AMBULATORY_CARE_PROVIDER_SITE_OTHER): Payer: Medicare HMO | Admitting: Internal Medicine

## 2015-12-08 VITALS — BP 122/70 | HR 92 | Temp 97.8°F | Ht 64.25 in | Wt 157.8 lb

## 2015-12-08 DIAGNOSIS — J301 Allergic rhinitis due to pollen: Secondary | ICD-10-CM | POA: Diagnosis not present

## 2015-12-08 DIAGNOSIS — Z7189 Other specified counseling: Secondary | ICD-10-CM

## 2015-12-08 DIAGNOSIS — I10 Essential (primary) hypertension: Secondary | ICD-10-CM

## 2015-12-08 DIAGNOSIS — E785 Hyperlipidemia, unspecified: Secondary | ICD-10-CM

## 2015-12-08 DIAGNOSIS — Z0000 Encounter for general adult medical examination without abnormal findings: Secondary | ICD-10-CM

## 2015-12-08 LAB — LIPID PANEL
CHOLESTEROL: 166 mg/dL (ref 0–200)
HDL: 52.6 mg/dL (ref >39.00)
LDL Cholesterol: 91 mg/dL (ref 0–99)
NonHDL: 112.97
Total CHOL/HDL Ratio: 3
Triglycerides: 111 mg/dL (ref 0.0–149.0)
VLDL: 22.2 mg/dL (ref 0.0–40.0)

## 2015-12-08 LAB — COMPREHENSIVE METABOLIC PANEL
ALBUMIN: 4.3 g/dL (ref 3.5–5.2)
ALK PHOS: 79 U/L (ref 39–117)
ALT: 22 U/L (ref 0–35)
AST: 24 U/L (ref 0–37)
BUN: 17 mg/dL (ref 6–23)
CO2: 29 mEq/L (ref 19–32)
CREATININE: 0.92 mg/dL (ref 0.40–1.20)
Calcium: 10.6 mg/dL — ABNORMAL HIGH (ref 8.4–10.5)
Chloride: 102 mEq/L (ref 96–112)
GFR: 64.63 mL/min (ref >60.00)
GLUCOSE: 97 mg/dL (ref 70–99)
Potassium: 4.5 mEq/L (ref 3.5–5.1)
SODIUM: 140 meq/L (ref 135–145)
TOTAL PROTEIN: 7.4 g/dL (ref 6.0–8.3)
Total Bilirubin: 0.8 mg/dL (ref 0.2–1.2)

## 2015-12-08 LAB — CBC WITH DIFFERENTIAL/PLATELET
BASOS ABS: 0 10*3/uL (ref 0.0–0.1)
BASOS PCT: 0.4 % (ref 0.0–3.0)
EOS PCT: 1.9 % (ref 0.0–5.0)
Eosinophils Absolute: 0.2 10*3/uL (ref 0.0–0.7)
HCT: 46.4 % — ABNORMAL HIGH (ref 36.0–46.0)
Hemoglobin: 15.8 g/dL — ABNORMAL HIGH (ref 12.0–15.0)
LYMPHS ABS: 3.1 10*3/uL (ref 0.7–4.0)
Lymphocytes Relative: 31.4 % (ref 12.0–46.0)
MCHC: 34 g/dL (ref 30.0–36.0)
MCV: 95.2 fl (ref 78.0–100.0)
MONOS PCT: 5.5 % (ref 3.0–12.0)
Monocytes Absolute: 0.5 10*3/uL (ref 0.1–1.0)
NEUTROS ABS: 6 10*3/uL (ref 1.4–7.7)
NEUTROS PCT: 60.8 % (ref 43.0–77.0)
PLATELETS: 279 10*3/uL (ref 150.0–400.0)
RBC: 4.87 Mil/uL (ref 3.87–5.11)
RDW: 13.7 % (ref 11.5–15.5)
WBC: 9.9 10*3/uL (ref 4.0–10.5)

## 2015-12-08 LAB — T4, FREE: Free T4: 0.89 ng/dL (ref 0.60–1.60)

## 2015-12-08 NOTE — Assessment & Plan Note (Signed)
I have personally reviewed the Medicare Annual Wellness questionnaire and have noted 1. The patient's medical and social history 2. Their use of alcohol, tobacco or illicit drugs 3. Their current medications and supplements 4. The patient's functional ability including ADL's, fall risks, home safety risks and hearing or visual             impairment. 5. Diet and physical activities 6. Evidence for depression or mood disorders  The patients weight, height, BMI and visual acuity have been recorded in the chart I have made referrals, counseling and provided education to the patient based review of the above and I have provided the pt with a written personalized care plan for preventive services.  I have provided you with a copy of your personalized plan for preventive services. Please take the time to review along with your updated medication list.  UTD for imms Colon due 2020 Overdue for mammo--asked her to schedule Keep up exercise--watch weight

## 2015-12-08 NOTE — Assessment & Plan Note (Signed)
No problems with statin Will continue primary prevention 

## 2015-12-08 NOTE — Patient Instructions (Signed)
Please set up your screening mammogram. 

## 2015-12-08 NOTE — Progress Notes (Signed)
Subjective:    Patient ID: Belinda Perry, female    DOB: 1946-07-05, 70 y.o.   MRN: [id number]  HPI Here for Medicare wellness and follow up of chronic health conditions Reviewed form and advanced directives Reviewed other doctors--dentist and eye doctor only No major vision issues---due for recheck Hearing is fine---checked the wrong responses on form No falls Exercises fairly regularly No depression or anhedonia Independent with instrumental ADLs No cognitive problems  Still enjoying life Travelling, etc  No chest pain No SOB No edema --other than when she travels and prolonged sitting No dizziness or syncope No palpitations  No problems with statin No myalgias or GI problems  Ongoing allergy symptoms Generally satisfied with the treatment--but tries to modulate and vary her treatment  Current Outpatient Prescriptions on File Prior to Visit  Medication Sig Dispense Refill  . aspirin 81 MG tablet Take 81 mg by mouth daily.    . cetirizine (ZYRTEC) 10 MG tablet Take 10 mg by mouth daily as needed for allergies.    . fluticasone (FLONASE) 50 MCG/ACT nasal spray Place 2 sprays into both nostrils daily as needed for allergies or rhinitis.    Marland Kitchen lisinopril-hydrochlorothiazide (PRINZIDE,ZESTORETIC) 20-12.5 MG tablet TAKE 1 TABLET BY MOUTH EVERY DAY 90 tablet 1  . Multiple Vitamins-Minerals (MULTIVITAL) CHEW Chew 1 tablet by mouth daily.    . OMEGA 3 1200 MG CAPS Take 2 capsules by mouth daily.    . simvastatin (ZOCOR) 40 MG tablet TAKE 1 TABLET (40 MG TOTAL) BY MOUTH AT BEDTIME. 90 tablet 3   No current facility-administered medications on file prior to visit.    Allergies  Allergen Reactions  . Codeine     "makes me feel hyper"    Past Medical History  Diagnosis Date  . Hypertension   . Hyperlipidemia   . Other osteoporosis   . Allergic reaction to inhaled pollen   . Osteoarthritis, generalized   . Allergy   . Skin cancer, basal cell 1994    face    Past  Surgical History  Procedure Laterality Date  . Foot surgery Bilateral     right 8/12, 11/12 for left. Dr Milinda Pointer  . Tubal ligation    . Tonsillectomy N/A 1970  . Breast enhancement surgery Bilateral 1973    Family History  Problem Relation Age of Onset  . Heart disease Mother   . Heart disease Father   . Diabetes Father   . Hypertension Father   . Stroke Sister   . Heart disease Brother   . Cancer Maternal Grandfather     colon  . Colon cancer Maternal Grandfather 89  . Cancer Other     various cancers  . Colon cancer Paternal Uncle 9    Social History   Social History  . Marital Status: Married    Spouse Name: N/A  . Number of Children: 1  . Years of Education: N/A   Occupational History  . 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 Smoking status: Never Smoker   . Smokeless tobacco: Never Used  . Alcohol Use: No  . Drug Use: No  . Sexual Activity: Not on file   Other Topics Concern  . Not on file   Social History Narrative   1 daughter      No living will   Husband should be health care POA   Would accept resuscitation but no prolonged machines   No feeding tubes if cognitively  unaware   Review of Systems Appetite is okay Weight is up 10#--needs to work on this Sleeps okay Wears seat belt Teeth okay--keeps up with dentist Bowels are fine on [place]. No blood Some urinary urgency--no dysuria No sig back or joint pain No heartburn or swallowing problems    Objective:   Physical Exam  Constitutional: She is oriented to person, place, and time. She appears well-developed and well-nourished. No distress.  HENT:  Mouth/Throat: Oropharynx is clear and moist. No oropharyngeal exudate.  Neck: Normal range of motion. Neck supple. No thyromegaly present.  Cardiovascular: Normal rate, regular rhythm, normal heart sounds and intact distal pulses.  Exam reveals no gallop.   No murmur heard. Pulmonary/Chest: Effort normal and  breath sounds normal. No respiratory distress. She has no wheezes. She has no rales.  Abdominal: Soft. There is no tenderness.  Musculoskeletal: She exhibits no edema or tenderness.  Lymphadenopathy:    She has no cervical adenopathy.  Neurological: She is alert and oriented to person, place, and time.  President-- "Daisy Floro, Obama, Bush" [phone] D-l-r-o-w Recall 3/3  Skin: No rash noted. No erythema.  Psychiatric: She has a normal mood and affect. Her behavior is normal.          Assessment & Plan:

## 2015-12-08 NOTE — Progress Notes (Signed)
Pre visit review using our clinic review tool, if applicable. No additional management support is needed unless otherwise documented below in the visit note. 

## 2015-12-08 NOTE — Assessment & Plan Note (Signed)
Okay on OTC regimen

## 2015-12-08 NOTE — Assessment & Plan Note (Signed)
BP Readings from Last 3 Encounters:  12/08/15 122/70  01/06/15 120/80  11/23/14 128/80   Good control Due for labs

## 2015-12-08 NOTE — Assessment & Plan Note (Signed)
Discussed at length She considered DNR but decided against Urged her to do formal papers---blanks given

## 2015-12-17 ENCOUNTER — Other Ambulatory Visit: Payer: Self-pay | Admitting: Internal Medicine

## 2015-12-28 ENCOUNTER — Other Ambulatory Visit: Payer: Self-pay | Admitting: Internal Medicine

## 2016-03-17 ENCOUNTER — Other Ambulatory Visit: Payer: Self-pay | Admitting: Internal Medicine

## 2016-03-29 ENCOUNTER — Other Ambulatory Visit: Payer: Self-pay | Admitting: Internal Medicine

## 2016-03-29 DIAGNOSIS — Z1231 Encounter for screening mammogram for malignant neoplasm of breast: Secondary | ICD-10-CM

## 2016-03-31 ENCOUNTER — Ambulatory Visit
Admission: RE | Admit: 2016-03-31 | Discharge: 2016-03-31 | Disposition: A | Discharge status: Home / Self Care | Payer: Medicare HMO | Source: Ambulatory Visit | Attending: Internal Medicine | Admitting: Internal Medicine

## 2016-03-31 DIAGNOSIS — Z1231 Encounter for screening mammogram for malignant neoplasm of breast: Secondary | ICD-10-CM

## 2016-04-25 DIAGNOSIS — D225 Melanocytic nevi of trunk: Secondary | ICD-10-CM | POA: Diagnosis not present

## 2016-04-25 DIAGNOSIS — D2272 Melanocytic nevi of left lower limb, including hip: Secondary | ICD-10-CM | POA: Diagnosis not present

## 2016-04-25 DIAGNOSIS — L821 Other seborrheic keratosis: Secondary | ICD-10-CM | POA: Diagnosis not present

## 2016-04-25 DIAGNOSIS — D2271 Melanocytic nevi of right lower limb, including hip: Secondary | ICD-10-CM | POA: Diagnosis not present

## 2016-05-09 ENCOUNTER — Ambulatory Visit (INDEPENDENT_AMBULATORY_CARE_PROVIDER_SITE_OTHER): Payer: Medicare HMO

## 2016-05-09 DIAGNOSIS — Z23 Encounter for immunization: Secondary | ICD-10-CM

## 2016-07-04 DIAGNOSIS — E7800 Pure hypercholesterolemia, unspecified: Secondary | ICD-10-CM | POA: Diagnosis not present

## 2016-07-04 DIAGNOSIS — I10 Essential (primary) hypertension: Secondary | ICD-10-CM | POA: Diagnosis not present

## 2016-09-04 ENCOUNTER — Other Ambulatory Visit: Payer: Self-pay | Admitting: Internal Medicine

## 2016-09-13 DIAGNOSIS — R69 Illness, unspecified: Secondary | ICD-10-CM | POA: Diagnosis not present

## 2016-11-30 ENCOUNTER — Other Ambulatory Visit: Payer: Self-pay | Admitting: Internal Medicine

## 2016-12-08 ENCOUNTER — Ambulatory Visit: Payer: Medicare HMO

## 2016-12-12 ENCOUNTER — Encounter: Payer: Medicare HMO | Admitting: Internal Medicine

## 2017-01-04 DIAGNOSIS — Z0000 Encounter for general adult medical examination without abnormal findings: Secondary | ICD-10-CM | POA: Diagnosis not present

## 2017-01-04 DIAGNOSIS — I10 Essential (primary) hypertension: Secondary | ICD-10-CM | POA: Diagnosis not present

## 2017-01-04 DIAGNOSIS — E7800 Pure hypercholesterolemia, unspecified: Secondary | ICD-10-CM | POA: Diagnosis not present

## 2017-01-04 DIAGNOSIS — Z1389 Encounter for screening for other disorder: Secondary | ICD-10-CM | POA: Diagnosis not present

## 2017-01-04 DIAGNOSIS — J309 Allergic rhinitis, unspecified: Secondary | ICD-10-CM | POA: Diagnosis not present

## 2017-02-24 ENCOUNTER — Other Ambulatory Visit: Payer: Self-pay | Admitting: Internal Medicine

## 2017-02-25 ENCOUNTER — Other Ambulatory Visit: Payer: Self-pay | Admitting: Internal Medicine

## 2017-02-26 NOTE — Telephone Encounter (Signed)
Called pt to verify whether or not Dr Brooke Bonito was still her provider. There is a different PCP listed in her chart.

## 2017-03-21 DIAGNOSIS — H524 Presbyopia: Secondary | ICD-10-CM | POA: Diagnosis not present

## 2017-04-11 DIAGNOSIS — Z23 Encounter for immunization: Secondary | ICD-10-CM | POA: Diagnosis not present

## 2017-05-04 DIAGNOSIS — R69 Illness, unspecified: Secondary | ICD-10-CM | POA: Diagnosis not present

## 2017-07-12 DIAGNOSIS — I10 Essential (primary) hypertension: Secondary | ICD-10-CM | POA: Diagnosis not present

## 2017-10-18 DIAGNOSIS — R69 Illness, unspecified: Secondary | ICD-10-CM | POA: Diagnosis not present

## 2018-01-08 DIAGNOSIS — I10 Essential (primary) hypertension: Secondary | ICD-10-CM | POA: Diagnosis not present

## 2018-01-08 DIAGNOSIS — Z0000 Encounter for general adult medical examination without abnormal findings: Secondary | ICD-10-CM | POA: Diagnosis not present

## 2018-01-08 DIAGNOSIS — M85851 Other specified disorders of bone density and structure, right thigh: Secondary | ICD-10-CM | POA: Diagnosis not present

## 2018-01-08 DIAGNOSIS — E7800 Pure hypercholesterolemia, unspecified: Secondary | ICD-10-CM | POA: Diagnosis not present

## 2018-01-08 DIAGNOSIS — Z1389 Encounter for screening for other disorder: Secondary | ICD-10-CM | POA: Diagnosis not present

## 2018-01-25 ENCOUNTER — Other Ambulatory Visit: Payer: Self-pay | Admitting: Internal Medicine

## 2018-01-25 DIAGNOSIS — Z1231 Encounter for screening mammogram for malignant neoplasm of breast: Secondary | ICD-10-CM

## 2018-02-18 ENCOUNTER — Ambulatory Visit
Admission: RE | Admit: 2018-02-18 | Discharge: 2018-02-18 | Disposition: A | Discharge status: Home / Self Care | Payer: Medicare HMO | Source: Ambulatory Visit | Attending: Internal Medicine | Admitting: Internal Medicine

## 2018-02-18 DIAGNOSIS — Z1231 Encounter for screening mammogram for malignant neoplasm of breast: Secondary | ICD-10-CM

## 2018-02-26 DIAGNOSIS — M8588 Other specified disorders of bone density and structure, other site: Secondary | ICD-10-CM | POA: Diagnosis not present

## 2018-03-27 DIAGNOSIS — H2513 Age-related nuclear cataract, bilateral: Secondary | ICD-10-CM | POA: Diagnosis not present

## 2018-03-27 DIAGNOSIS — H25013 Cortical age-related cataract, bilateral: Secondary | ICD-10-CM | POA: Diagnosis not present

## 2018-03-27 DIAGNOSIS — D3131 Benign neoplasm of right choroid: Secondary | ICD-10-CM | POA: Diagnosis not present

## 2018-03-27 DIAGNOSIS — H524 Presbyopia: Secondary | ICD-10-CM | POA: Diagnosis not present

## 2018-04-24 DIAGNOSIS — Z23 Encounter for immunization: Secondary | ICD-10-CM | POA: Diagnosis not present

## 2018-05-24 DIAGNOSIS — R69 Illness, unspecified: Secondary | ICD-10-CM | POA: Diagnosis not present

## 2018-07-01 DIAGNOSIS — I10 Essential (primary) hypertension: Secondary | ICD-10-CM | POA: Diagnosis not present

## 2018-07-01 DIAGNOSIS — J309 Allergic rhinitis, unspecified: Secondary | ICD-10-CM | POA: Diagnosis not present

## 2019-01-14 DIAGNOSIS — Z0000 Encounter for general adult medical examination without abnormal findings: Secondary | ICD-10-CM | POA: Diagnosis not present

## 2019-01-14 DIAGNOSIS — Z1389 Encounter for screening for other disorder: Secondary | ICD-10-CM | POA: Diagnosis not present

## 2019-01-16 ENCOUNTER — Encounter: Payer: Self-pay | Admitting: Internal Medicine

## 2019-01-17 DIAGNOSIS — N3946 Mixed incontinence: Secondary | ICD-10-CM | POA: Diagnosis not present

## 2019-01-17 DIAGNOSIS — I10 Essential (primary) hypertension: Secondary | ICD-10-CM | POA: Diagnosis not present

## 2019-01-17 DIAGNOSIS — E7800 Pure hypercholesterolemia, unspecified: Secondary | ICD-10-CM | POA: Diagnosis not present

## 2019-01-29 ENCOUNTER — Encounter: Payer: Self-pay | Admitting: Internal Medicine

## 2019-02-19 DIAGNOSIS — I10 Essential (primary) hypertension: Secondary | ICD-10-CM | POA: Diagnosis not present

## 2019-03-10 ENCOUNTER — Other Ambulatory Visit: Payer: Self-pay

## 2019-03-10 ENCOUNTER — Ambulatory Visit: Payer: Medicare HMO | Admitting: *Deleted

## 2019-03-10 VITALS — Ht 64.0 in | Wt 146.0 lb

## 2019-03-10 DIAGNOSIS — Z86010 Personal history of colonic polyps: Secondary | ICD-10-CM

## 2019-03-10 MED ORDER — SUPREP BOWEL PREP KIT 17.5-3.13-1.6 GM/177ML PO SOLN: 1.0000 | Freq: Once | ORAL | 0 refills | Status: AC

## 2019-03-10 NOTE — Progress Notes (Signed)
No egg or soy allergy known to patient  No issues with past sedation with any surgeries  or procedures, no intubation problems  No diet pills per patient No home 02 use per patient  No blood thinners per patient  Pt denies issues with constipation  No A fib or A flutter  EMMI video sent to pt's e mail   Pt verified name, DOB, address and insurance during PV today. Pt mailed instruction packet to included paper to complete and mail back to [facility] with addressed and stamped envelope, Emmi video, copy of consent form to read and not return, and instructions.PV completed over the phone. Pt encouraged to call with questions or issues   Pt is aware that care partner will wait in the car during procedure; if they feel like they will be too hot to wait in the car; they may wait in the lobby.  We want them to wear a mask (we do not have any that we can provide them), practice social distancing, and we will check their temperatures when they get here.  I did remind patient that their care partner needs to stay in the parking lot the entire time. Pt will wear mask into building.  

## 2019-03-20 ENCOUNTER — Encounter: Payer: Self-pay | Admitting: Internal Medicine

## 2019-03-20 DIAGNOSIS — I10 Essential (primary) hypertension: Secondary | ICD-10-CM | POA: Diagnosis not present

## 2019-03-21 ENCOUNTER — Telehealth: Payer: Self-pay | Admitting: Internal Medicine

## 2019-03-21 NOTE — Telephone Encounter (Signed)

## 2019-03-24 ENCOUNTER — Other Ambulatory Visit: Payer: Self-pay

## 2019-03-24 ENCOUNTER — Ambulatory Visit (AMBULATORY_SURGERY_CENTER): Payer: Medicare HMO | Admitting: Internal Medicine

## 2019-03-24 ENCOUNTER — Encounter: Payer: Self-pay | Admitting: Internal Medicine

## 2019-03-24 VITALS — BP 142/60 | HR 83 | Temp 97.5°F | Resp 22 | Ht 64.25 in | Wt 157.0 lb

## 2019-03-24 DIAGNOSIS — Z86010 Personal history of colonic polyps: Secondary | ICD-10-CM

## 2019-03-24 DIAGNOSIS — D123 Benign neoplasm of transverse colon: Secondary | ICD-10-CM

## 2019-03-24 DIAGNOSIS — K635 Polyp of colon: Secondary | ICD-10-CM

## 2019-03-24 DIAGNOSIS — Z1211 Encounter for screening for malignant neoplasm of colon: Secondary | ICD-10-CM | POA: Diagnosis not present

## 2019-03-24 MED ORDER — SODIUM CHLORIDE 0.9 % IV SOLN: 500.0000 mL | Freq: Once | INTRAVENOUS | Status: DC

## 2019-03-24 NOTE — Progress Notes (Signed)
Called to room to assist during endoscopic procedure.  Patient ID and intended procedure confirmed with present staff. Received instructions for my participation in the procedure from the performing physician.  

## 2019-03-24 NOTE — Progress Notes (Signed)
A and O x3. Report to RN. Tolerated MAC anesthesia well.

## 2019-03-24 NOTE — Op Note (Signed)
[facility] Patient Name: Belinda Perry Procedure Date: 03/24/2019 8:06 AM MRN: [id number] Endoscopist: Jerene Bears , MD Age: 73 Referring MD:  Date of Birth: 02-18-1946 Gender: Female Account #: [id number] Procedure:                Colonoscopy Indications:              High risk colon cancer surveillance: Personal                            history of non-advanced adenoma, Personal history                            of sessile serrated colon polyp (less than 10 mm in                            size) with no dysplasia, Last colonoscopy: June 2015 Medicines:                Monitored Anesthesia Care, Propofol total dose 300                            mg IV Procedure:                Pre-Anesthesia Assessment:                           - Prior to the procedure, a History and Physical                            was performed, and patient medications and                            allergies were reviewed. The patient's tolerance of                            previous anesthesia was also reviewed. The risks                            and benefits of the procedure and the sedation                            options and risks were discussed with the patient.                            All questions were answered, and informed consent                            was obtained. Prior Anticoagulants: The patient has                            taken no previous anticoagulant or antiplatelet                            agents. ASA Grade Assessment: II - A patient with  mild systemic disease. After reviewing the risks                            and benefits, the patient was deemed in                            satisfactory condition to undergo the procedure.                           After obtaining informed consent, the colonoscope                            was passed under direct vision. Throughout the                            procedure, the patient's blood  pressure, pulse, and                            oxygen saturations were monitored continuously. The                            Colonoscope was introduced through the anus and                            advanced to the cecum, identified by appendiceal                            orifice and ileocecal valve. The colonoscopy was                            performed without difficulty. The patient tolerated                            the procedure well. The quality of the bowel                            preparation was good. The ileocecal valve,                            appendiceal orifice, and rectum were photographed. Scope In: 8:10:29 AM Scope Out: 8:33:46 AM Scope Withdrawal Time: 0 hours 16 minutes 1 second  Total Procedure Duration: 0 hours 23 minutes 17 seconds  Findings:                 The digital rectal exam was normal.                           Two sessile polyps were found in the transverse                            colon. The polyps were 3 to 4 mm in size. These                            polyps were removed with a cold  snare. Resection                            and retrieval were complete.                           A 5 mm polyp was found in the sigmoid colon. The                            polyp was sessile. The polyp was removed with a                            cold snare. Resection and retrieval were complete.                           Multiple small and large-mouthed diverticula were                            found in the sigmoid colon, transverse colon and                            hepatic flexure.                           Internal hemorrhoids were found during                            retroflexion. The hemorrhoids were small. Complications:            No immediate complications. Estimated Blood Loss:     Estimated blood loss was minimal. Impression:               - Two 3 to 4 mm polyps in the transverse colon,                            removed with a cold snare.  Resected and retrieved.                           - One 5 mm polyp in the sigmoid colon, removed with                            a cold snare. Resected and retrieved.                           - Diverticulosis in the sigmoid colon, in the                            transverse colon and at the hepatic flexure.                           - Small internal hemorrhoids. Recommendation:           - Patient has a contact number available for  emergencies. The signs and symptoms of potential                            delayed complications were discussed with the                            patient. Return to normal activities tomorrow.                            Written discharge instructions were provided to the                            patient.                           - Resume previous diet.                           - Continue present medications.                           - Await pathology results.                           - Repeat colonoscopy is recommended for                            surveillance. The colonoscopy date will be                            determined after pathology results from today's                            exam become available for review. Jerene Bears, MD 03/24/2019 8:38:50 AM This report has been signed electronically.

## 2019-03-24 NOTE — Progress Notes (Signed)
JB- Temp CW- Vitals 

## 2019-03-24 NOTE — Patient Instructions (Signed)
Information on polyps, hemorrhoids, and diverticulosis given to you today.  Await pathology results.  YOU HAD AN ENDOSCOPIC PROCEDURE TODAY AT [facility]:   Refer to the procedure report that was given to you for any specific questions about what was found during the examination.  If the procedure report does not answer your questions, please call your gastroenterologist to clarify.  If you requested that your care partner not be given the details of your procedure findings, then the procedure report has been included in a sealed envelope for you to review at your convenience later.  YOU SHOULD EXPECT: Some feelings of bloating in the abdomen. Passage of more gas than usual.  Walking can help get rid of the air that was put into your GI tract during the procedure and reduce the bloating. If you had a lower endoscopy (such as a colonoscopy or flexible sigmoidoscopy) you may notice spotting of blood in your stool or on the toilet paper. If you underwent a bowel prep for your procedure, you may not have a normal bowel movement for a few days.  Please Note:  You might notice some irritation and congestion in your nose or some drainage.  This is from the oxygen used during your procedure.  There is no need for concern and it should clear up in a day or so.  SYMPTOMS TO REPORT IMMEDIATELY:   Following lower endoscopy (colonoscopy or flexible sigmoidoscopy):  Excessive amounts of blood in the stool  Significant tenderness or worsening of abdominal pains  Swelling of the abdomen that is new, acute  Fever of 100F or higher   For urgent or emergent issues, a gastroenterologist can be reached at any hour by calling [phone].   DIET:  We do recommend a small meal at first, but then you may proceed to your regular diet.  Drink plenty of fluids but you should avoid alcoholic beverages for 24 hours.  ACTIVITY:  You should plan to take it easy for the rest of today and you should  NOT DRIVE or use heavy machinery until tomorrow (because of the sedation medicines used during the test).    FOLLOW UP: Our staff will call the number listed on your records 48-72 hours following your procedure to check on you and address any questions or concerns that you may have regarding the information given to you following your procedure. If we do not reach you, we will leave a message.  We will attempt to reach you two times.  During this call, we will ask if you have developed any symptoms of COVID 19. If you develop any symptoms (ie: fever, flu-like symptoms, shortness of breath, cough etc.) before then, please call [phone].  If you test positive for Covid 19 in the 2 weeks post procedure, please call and report this information to [place].    If any biopsies were taken you will be contacted by phone or by letter within the next 1-3 weeks.  Please call us at [phone] if you have not heard about the biopsies in 3 weeks.    SIGNATURES/CONFIDENTIALITY: You and/or your care partner have signed paperwork which will be entered into your electronic medical record.  These signatures attest to the fact that that the information above on your After Visit Summary has been reviewed and is understood.  Full responsibility of the confidentiality of this discharge information lies with you and/or your care-partner.

## 2019-03-24 NOTE — Progress Notes (Signed)
Pt's states no medical or surgical changes since previsit or office visit. 

## 2019-03-26 ENCOUNTER — Telehealth: Payer: Self-pay

## 2019-03-26 NOTE — Telephone Encounter (Signed)
  Follow up Call-  Call back number 03/24/2019  Post procedure Call Back phone  # [phone]  Permission to leave phone message Yes  Some recent data might be hidden     Patient questions:  Do you have a fever, pain , or abdominal swelling? No. Pain Score  0 *  Have you tolerated food without any problems? Yes.    Have you been able to return to your normal activities? Yes.    Do you have any questions about your discharge instructions: Diet   No. Medications  No. Follow up visit  No.  Do you have questions or concerns about your Care? No.  Actions: * If pain score is 4 or above: No action needed, pain <4. 1. Have you developed a fever since your procedure? no  2.   Have you had an respiratory symptoms (SOB or cough) since your procedure? no  3.   Have you tested positive for COVID 19 since your procedure no  4.   Have you had any family members/close contacts diagnosed with the COVID 19 since your procedure?  no   If yes to any of these questions please route to Joylene John, RN and Alphonsa Gin, Therapist, sports.

## 2019-03-27 ENCOUNTER — Encounter: Payer: Self-pay | Admitting: Internal Medicine

## 2019-04-09 DIAGNOSIS — Z23 Encounter for immunization: Secondary | ICD-10-CM | POA: Diagnosis not present

## 2019-06-04 DIAGNOSIS — R69 Illness, unspecified: Secondary | ICD-10-CM | POA: Diagnosis not present

## 2019-08-20 DIAGNOSIS — I10 Essential (primary) hypertension: Secondary | ICD-10-CM | POA: Diagnosis not present

## 2019-08-20 DIAGNOSIS — E7800 Pure hypercholesterolemia, unspecified: Secondary | ICD-10-CM | POA: Diagnosis not present

## 2019-08-25 DIAGNOSIS — J309 Allergic rhinitis, unspecified: Secondary | ICD-10-CM | POA: Diagnosis not present

## 2019-08-25 DIAGNOSIS — I10 Essential (primary) hypertension: Secondary | ICD-10-CM | POA: Diagnosis not present

## 2019-09-26 DIAGNOSIS — I10 Essential (primary) hypertension: Secondary | ICD-10-CM | POA: Diagnosis not present

## 2019-09-26 DIAGNOSIS — E7800 Pure hypercholesterolemia, unspecified: Secondary | ICD-10-CM | POA: Diagnosis not present

## 2019-10-03 ENCOUNTER — Ambulatory Visit: Payer: Medicare HMO | Attending: Internal Medicine

## 2019-10-03 DIAGNOSIS — Z23 Encounter for immunization: Secondary | ICD-10-CM | POA: Insufficient documentation

## 2019-10-03 NOTE — Progress Notes (Signed)
   Covid-19 Vaccination Clinic  Name:  Rynn Barrick    MRN: [id number] DOB: 03-24-1946  10/03/2019  Ms. Glanzman was observed post Covid-19 immunization for 15 minutes without incident. She was provided with Vaccine Information Sheet and instruction to access the V-Safe system.   Ms. Moniz was instructed to call 911 with any severe reactions post vaccine: Marland Kitchen Difficulty breathing  . Swelling of face and throat  . A fast heartbeat  . A bad rash all over body  . Dizziness and weakness   Immunizations Administered    Name Date Dose VIS Date Route   Pfizer COVID-19 Vaccine 10/03/2019  9:22 AM 0.3 mL 07/11/2019 Intramuscular   Manufacturer: [place]   Lot: [id number]   [place]: [id number]

## 2019-10-07 DIAGNOSIS — E7800 Pure hypercholesterolemia, unspecified: Secondary | ICD-10-CM | POA: Diagnosis not present

## 2019-10-07 DIAGNOSIS — I10 Essential (primary) hypertension: Secondary | ICD-10-CM | POA: Diagnosis not present

## 2019-10-24 ENCOUNTER — Ambulatory Visit: Payer: Medicare HMO | Attending: Internal Medicine

## 2019-10-24 DIAGNOSIS — Z23 Encounter for immunization: Secondary | ICD-10-CM

## 2019-10-24 NOTE — Progress Notes (Signed)
   Covid-19 Vaccination Clinic  Name:  Amazing Coolbaugh    MRN: [id number] DOB: Sep 06, 1945  10/24/2019  Ms. Oliveto was observed post Covid-19 immunization for 15 minutes without incident. She was provided with Vaccine Information Sheet and instruction to access the V-Safe system.   Ms. Loeber was instructed to call 911 with any severe reactions post vaccine: Marland Kitchen Difficulty breathing  . Swelling of face and throat  . A fast heartbeat  . A bad rash all over body  . Dizziness and weakness   Immunizations Administered    Name Date Dose VIS Date Route   Pfizer COVID-19 Vaccine 10/24/2019  8:54 AM 0.3 mL 07/11/2019 Intramuscular   Manufacturer: [place]   Lot: [id number]   [place]: [id number]

## 2019-11-07 ENCOUNTER — Other Ambulatory Visit: Payer: Self-pay | Admitting: Internal Medicine

## 2019-11-07 DIAGNOSIS — Z5181 Encounter for therapeutic drug level monitoring: Secondary | ICD-10-CM | POA: Diagnosis not present

## 2019-11-07 DIAGNOSIS — I10 Essential (primary) hypertension: Secondary | ICD-10-CM | POA: Diagnosis not present

## 2019-11-07 DIAGNOSIS — Z1231 Encounter for screening mammogram for malignant neoplasm of breast: Secondary | ICD-10-CM

## 2019-11-11 ENCOUNTER — Ambulatory Visit
Admission: RE | Admit: 2019-11-11 | Discharge: 2019-11-11 | Disposition: A | Discharge status: Home / Self Care | Payer: Medicare HMO | Source: Ambulatory Visit | Attending: Internal Medicine | Admitting: Internal Medicine

## 2019-11-11 ENCOUNTER — Other Ambulatory Visit: Payer: Self-pay | Admitting: Internal Medicine

## 2019-11-11 ENCOUNTER — Other Ambulatory Visit: Payer: Self-pay

## 2019-11-11 DIAGNOSIS — Z1231 Encounter for screening mammogram for malignant neoplasm of breast: Secondary | ICD-10-CM | POA: Diagnosis not present

## 2019-12-02 DIAGNOSIS — M6289 Other specified disorders of muscle: Secondary | ICD-10-CM | POA: Diagnosis not present

## 2019-12-02 DIAGNOSIS — R531 Weakness: Secondary | ICD-10-CM | POA: Diagnosis not present

## 2019-12-02 DIAGNOSIS — M7989 Other specified soft tissue disorders: Secondary | ICD-10-CM | POA: Diagnosis not present

## 2019-12-02 DIAGNOSIS — R7989 Other specified abnormal findings of blood chemistry: Secondary | ICD-10-CM | POA: Diagnosis not present

## 2019-12-02 DIAGNOSIS — I10 Essential (primary) hypertension: Secondary | ICD-10-CM | POA: Diagnosis not present

## 2019-12-03 ENCOUNTER — Inpatient Hospital Stay (HOSPITAL_COMMUNITY)
Admission: EM | Admit: 2019-12-03 | Discharge: 2019-12-05 | DRG: 270 | Disposition: A | Discharge status: Home / Self Care | Payer: Medicare HMO | Source: Ambulatory Visit | Attending: Internal Medicine | Admitting: Internal Medicine

## 2019-12-03 ENCOUNTER — Ambulatory Visit (HOSPITAL_BASED_OUTPATIENT_CLINIC_OR_DEPARTMENT_OTHER)
Admission: RE | Admit: 2019-12-03 | Discharge: 2019-12-03 | Disposition: A | Discharge status: Home / Self Care | Payer: Medicare HMO | Source: Ambulatory Visit | Attending: Internal Medicine | Admitting: Internal Medicine

## 2019-12-03 ENCOUNTER — Emergency Department (HOSPITAL_COMMUNITY): Payer: Medicare HMO

## 2019-12-03 ENCOUNTER — Encounter (HOSPITAL_COMMUNITY): Payer: Self-pay

## 2019-12-03 ENCOUNTER — Other Ambulatory Visit (HOSPITAL_COMMUNITY): Payer: Self-pay | Admitting: Internal Medicine

## 2019-12-03 ENCOUNTER — Inpatient Hospital Stay (HOSPITAL_COMMUNITY): Payer: Medicare HMO

## 2019-12-03 ENCOUNTER — Other Ambulatory Visit: Payer: Self-pay | Admitting: Physician Assistant

## 2019-12-03 ENCOUNTER — Other Ambulatory Visit: Payer: Self-pay

## 2019-12-03 DIAGNOSIS — Z885 Allergy status to narcotic agent status: Secondary | ICD-10-CM

## 2019-12-03 DIAGNOSIS — Z87891 Personal history of nicotine dependence: Secondary | ICD-10-CM | POA: Diagnosis not present

## 2019-12-03 DIAGNOSIS — E041 Nontoxic single thyroid nodule: Secondary | ICD-10-CM | POA: Diagnosis not present

## 2019-12-03 DIAGNOSIS — E559 Vitamin D deficiency, unspecified: Secondary | ICD-10-CM | POA: Diagnosis present

## 2019-12-03 DIAGNOSIS — M7989 Other specified soft tissue disorders: Secondary | ICD-10-CM | POA: Diagnosis not present

## 2019-12-03 DIAGNOSIS — N858 Other specified noninflammatory disorders of uterus: Secondary | ICD-10-CM | POA: Diagnosis present

## 2019-12-03 DIAGNOSIS — I82421 Acute embolism and thrombosis of right iliac vein: Secondary | ICD-10-CM

## 2019-12-03 DIAGNOSIS — I82401 Acute embolism and thrombosis of unspecified deep veins of right lower extremity: Secondary | ICD-10-CM | POA: Diagnosis not present

## 2019-12-03 DIAGNOSIS — I824Y9 Acute embolism and thrombosis of unspecified deep veins of unspecified proximal lower extremity: Secondary | ICD-10-CM | POA: Diagnosis not present

## 2019-12-03 DIAGNOSIS — Z823 Family history of stroke: Secondary | ICD-10-CM | POA: Diagnosis not present

## 2019-12-03 DIAGNOSIS — Z85828 Personal history of other malignant neoplasm of skin: Secondary | ICD-10-CM | POA: Diagnosis not present

## 2019-12-03 DIAGNOSIS — Z8719 Personal history of other diseases of the digestive system: Secondary | ICD-10-CM

## 2019-12-03 DIAGNOSIS — Z20822 Contact with and (suspected) exposure to covid-19: Secondary | ICD-10-CM | POA: Diagnosis not present

## 2019-12-03 DIAGNOSIS — I82411 Acute embolism and thrombosis of right femoral vein: Secondary | ICD-10-CM | POA: Diagnosis not present

## 2019-12-03 DIAGNOSIS — E785 Hyperlipidemia, unspecified: Secondary | ICD-10-CM | POA: Diagnosis present

## 2019-12-03 DIAGNOSIS — I2699 Other pulmonary embolism without acute cor pulmonale: Secondary | ICD-10-CM | POA: Diagnosis not present

## 2019-12-03 DIAGNOSIS — I82441 Acute embolism and thrombosis of right tibial vein: Secondary | ICD-10-CM | POA: Diagnosis present

## 2019-12-03 DIAGNOSIS — M810 Age-related osteoporosis without current pathological fracture: Secondary | ICD-10-CM | POA: Diagnosis present

## 2019-12-03 DIAGNOSIS — I2694 Multiple subsegmental pulmonary emboli without acute cor pulmonale: Secondary | ICD-10-CM | POA: Diagnosis not present

## 2019-12-03 DIAGNOSIS — I959 Hypotension, unspecified: Secondary | ICD-10-CM | POA: Diagnosis not present

## 2019-12-03 DIAGNOSIS — R0602 Shortness of breath: Secondary | ICD-10-CM | POA: Diagnosis not present

## 2019-12-03 DIAGNOSIS — I82431 Acute embolism and thrombosis of right popliteal vein: Secondary | ICD-10-CM | POA: Diagnosis not present

## 2019-12-03 DIAGNOSIS — Z8249 Family history of ischemic heart disease and other diseases of the circulatory system: Secondary | ICD-10-CM

## 2019-12-03 DIAGNOSIS — I2693 Single subsegmental pulmonary embolism without acute cor pulmonale: Secondary | ICD-10-CM | POA: Diagnosis not present

## 2019-12-03 DIAGNOSIS — I82220 Acute embolism and thrombosis of inferior vena cava: Secondary | ICD-10-CM | POA: Diagnosis not present

## 2019-12-03 DIAGNOSIS — D259 Leiomyoma of uterus, unspecified: Secondary | ICD-10-CM | POA: Diagnosis present

## 2019-12-03 DIAGNOSIS — J301 Allergic rhinitis due to pollen: Secondary | ICD-10-CM | POA: Diagnosis present

## 2019-12-03 DIAGNOSIS — I82451 Acute embolism and thrombosis of right peroneal vein: Secondary | ICD-10-CM | POA: Diagnosis present

## 2019-12-03 DIAGNOSIS — Z800 Family history of malignant neoplasm of digestive organs: Secondary | ICD-10-CM

## 2019-12-03 DIAGNOSIS — Z833 Family history of diabetes mellitus: Secondary | ICD-10-CM | POA: Diagnosis not present

## 2019-12-03 DIAGNOSIS — I10 Essential (primary) hypertension: Secondary | ICD-10-CM | POA: Diagnosis present

## 2019-12-03 DIAGNOSIS — K5730 Diverticulosis of large intestine without perforation or abscess without bleeding: Secondary | ICD-10-CM | POA: Diagnosis not present

## 2019-12-03 DIAGNOSIS — M79604 Pain in right leg: Secondary | ICD-10-CM

## 2019-12-03 DIAGNOSIS — Z79899 Other long term (current) drug therapy: Secondary | ICD-10-CM | POA: Diagnosis not present

## 2019-12-03 DIAGNOSIS — I82409 Acute embolism and thrombosis of unspecified deep veins of unspecified lower extremity: Secondary | ICD-10-CM | POA: Diagnosis present

## 2019-12-03 DIAGNOSIS — R531 Weakness: Secondary | ICD-10-CM | POA: Diagnosis not present

## 2019-12-03 DIAGNOSIS — I82461 Acute embolism and thrombosis of right calf muscular vein: Secondary | ICD-10-CM | POA: Diagnosis present

## 2019-12-03 LAB — CBC WITH DIFFERENTIAL/PLATELET
Abs Immature Granulocytes: 0.02 10*3/uL (ref 0.00–0.07)
Basophils Absolute: 0.1 10*3/uL (ref 0.0–0.1)
Basophils Relative: 1 %
Eosinophils Absolute: 0.2 10*3/uL (ref 0.0–0.5)
Eosinophils Relative: 2 %
HCT: 39.3 % (ref 36.0–46.0)
Hemoglobin: 12.8 g/dL (ref 12.0–15.0)
Immature Granulocytes: 0 %
Lymphocytes Relative: 19 %
Lymphs Abs: 1.9 10*3/uL (ref 0.7–4.0)
MCH: 31.6 pg (ref 26.0–34.0)
MCHC: 32.6 g/dL (ref 30.0–36.0)
MCV: 97 fL (ref 80.0–100.0)
Monocytes Absolute: 0.7 10*3/uL (ref 0.1–1.0)
Monocytes Relative: 7 %
Neutro Abs: 6.9 10*3/uL (ref 1.7–7.7)
Neutrophils Relative %: 71 %
Platelets: 411 10*3/uL — ABNORMAL HIGH (ref 150–400)
RBC: 4.05 MIL/uL (ref 3.87–5.11)
RDW: 12.3 % (ref 11.5–15.5)
WBC: 9.7 10*3/uL (ref 4.0–10.5)
nRBC: 0 % (ref 0.0–0.2)

## 2019-12-03 LAB — BASIC METABOLIC PANEL
Anion gap: 13 (ref 5–15)
BUN: 27 mg/dL — ABNORMAL HIGH (ref 8–23)
CO2: 26 mmol/L (ref 22–32)
Calcium: 9.9 mg/dL (ref 8.9–10.3)
Chloride: 98 mmol/L (ref 98–111)
Creatinine, Ser: 1.33 mg/dL — ABNORMAL HIGH (ref 0.44–1.00)
GFR calc Af Amer: 46 mL/min — ABNORMAL LOW (ref >60)
GFR calc non Af Amer: 40 mL/min — ABNORMAL LOW (ref >60)
Glucose, Bld: 112 mg/dL — ABNORMAL HIGH (ref 70–99)
Potassium: 3.6 mmol/L (ref 3.5–5.1)
Sodium: 137 mmol/L (ref 135–145)

## 2019-12-03 LAB — APTT: aPTT: 47 seconds — ABNORMAL HIGH (ref 24–36)

## 2019-12-03 LAB — ANTITHROMBIN III: AntiThromb III Func: 117 % (ref 75–120)

## 2019-12-03 LAB — HEPARIN LEVEL (UNFRACTIONATED): Heparin Unfractionated: >2.2 IU/mL — ABNORMAL HIGH (ref 0.30–0.70)

## 2019-12-03 MED ORDER — HEPARIN (PORCINE) 25000 UT/250ML-% IV SOLN: 12.0000 [IU]/kg/h | INTRAVENOUS | Status: DC

## 2019-12-03 MED ORDER — SODIUM CHLORIDE 0.9 % IV SOLN: INTRAVENOUS | Status: DC

## 2019-12-03 MED ORDER — SIMVASTATIN 20 MG PO TABS
40.0000 mg | ORAL_TABLET | Freq: Every day | ORAL | Status: DC
  Administered 2019-12-03 – 2019-12-04 (×2): 40 mg via ORAL
  Filled 2019-12-03 (×2): qty 2

## 2019-12-03 MED ORDER — OXYCODONE HCL 5 MG PO TABS: 5.0000 mg | ORAL_TABLET | ORAL | Status: DC | PRN

## 2019-12-03 MED ORDER — ONDANSETRON HCL 4 MG/2ML IJ SOLN: 4.0000 mg | Freq: Four times a day (QID) | INTRAMUSCULAR | Status: DC | PRN

## 2019-12-03 MED ORDER — ACETAMINOPHEN 325 MG PO TABS: 650.0000 mg | ORAL_TABLET | Freq: Four times a day (QID) | ORAL | Status: DC | PRN

## 2019-12-03 MED ORDER — SODIUM CHLORIDE (PF) 0.9 % IJ SOLN
INTRAMUSCULAR | Status: AC
  Filled 2019-12-03: qty 50

## 2019-12-03 MED ORDER — HEPARIN SODIUM (PORCINE) 5000 UNIT/ML IJ SOLN: 60.0000 [IU]/kg | Freq: Once | INTRAMUSCULAR | Status: DC

## 2019-12-03 MED ORDER — FLUTICASONE PROPIONATE 50 MCG/ACT NA SUSP
2.0000 | Freq: Every day | NASAL | Status: DC | PRN
  Filled 2019-12-03: qty 16

## 2019-12-03 MED ORDER — SODIUM CHLORIDE 0.9% FLUSH
3.0000 mL | Freq: Two times a day (BID) | INTRAVENOUS | Status: DC
  Administered 2019-12-03: 3 mL via INTRAVENOUS

## 2019-12-03 MED ORDER — LORATADINE 10 MG PO TABS
10.0000 mg | ORAL_TABLET | Freq: Every day | ORAL | Status: DC
  Filled 2019-12-03: qty 1

## 2019-12-03 MED ORDER — MAGNESIUM CITRATE PO SOLN: 1.0000 | Freq: Once | ORAL | Status: DC | PRN

## 2019-12-03 MED ORDER — ADULT MULTIVITAMIN W/MINERALS CH
1.0000 | ORAL_TABLET | Freq: Every day | ORAL | Status: DC
  Filled 2019-12-03: qty 1

## 2019-12-03 MED ORDER — ACETAMINOPHEN 650 MG RE SUPP: 650.0000 mg | Freq: Four times a day (QID) | RECTAL | Status: DC | PRN

## 2019-12-03 MED ORDER — SORBITOL 70 % SOLN
30.0000 mL | Freq: Every day | Status: DC | PRN
  Filled 2019-12-03: qty 30

## 2019-12-03 MED ORDER — HEPARIN (PORCINE) 25000 UT/250ML-% IV SOLN
750.0000 [IU]/h | INTRAVENOUS | Status: DC
  Administered 2019-12-03: 800 [IU]/h via INTRAVENOUS
  Filled 2019-12-03 (×2): qty 250

## 2019-12-03 MED ORDER — POLYETHYLENE GLYCOL 3350 17 G PO PACK: 17.0000 g | PACK | Freq: Every day | ORAL | Status: DC | PRN

## 2019-12-03 MED ORDER — SODIUM CHLORIDE 0.9 % IV SOLN: INTRAVENOUS | Status: AC

## 2019-12-03 MED ORDER — IOHEXOL 350 MG/ML SOLN
100.0000 mL | Freq: Once | INTRAVENOUS | Status: AC | PRN
  Administered 2019-12-03: 80 mL via INTRAVENOUS

## 2019-12-03 MED ORDER — HEPARIN BOLUS VIA INFUSION
4000.0000 [IU] | Freq: Once | INTRAVENOUS | Status: AC
  Administered 2019-12-03: 4000 [IU] via INTRAVENOUS
  Filled 2019-12-03: qty 4000

## 2019-12-03 MED ORDER — LEVALBUTEROL HCL 0.63 MG/3ML IN NEBU: 0.6300 mg | INHALATION_SOLUTION | Freq: Four times a day (QID) | RESPIRATORY_TRACT | Status: DC

## 2019-12-03 MED ORDER — LEVALBUTEROL HCL 0.63 MG/3ML IN NEBU: 0.6300 mg | INHALATION_SOLUTION | Freq: Four times a day (QID) | RESPIRATORY_TRACT | Status: DC | PRN

## 2019-12-03 MED ORDER — ONDANSETRON HCL 4 MG PO TABS: 4.0000 mg | ORAL_TABLET | Freq: Four times a day (QID) | ORAL | Status: DC | PRN

## 2019-12-03 NOTE — H&P (Signed)
History and Physical    Belinda Perry [id number] DOB: 02-05-1946 DOA: 12/03/2019  PCP: Lavone Orn, MD  Patient coming from: Home  I have personally briefly reviewed patient's old medical records in [place]  Chief Complaint: Rule out PE/right lower extremity DVT diagnosed at PCPs office on day of admission  HPI: Belinda Perry is a 74 y.o. female with medical history significant of hypertension, hyperlipidemia, skin cancer (1994), allergies, prior history of tobacco abuse quit in 2012, vitamin D deficiency, family history of DVT in grandmother presenting to the ED from PCPs office for evaluation for possible pulmonary emboli.  Reports that for about the past 2-1/2 weeks noted to have some right lower extremity calf pain, groin pain, lower extremity swelling, slight warmth and initially felt her muscles were just tightening.  Patient also noted to have had bouts of low blood pressure and as such antihypertensive medications recently placed on hold.  Patient was seen in PCPs office 1 day prior to admission and the day of admission for evaluation where lab work was obtained.  D-dimer noted to be elevated at 16 in combination with a low blood pressure patient was sent to the ED for CT angiogram of chest to rule out PE.  Patient had a Doppler ultrasound done on the day of admission at PCPs office which was positive for large right lower extremity DVT(acute DVT involving right external iliac vein, common femoral vein, SF junction, right femoral vein, right proximal profunda vein, right popliteal vein, right posterior tibial veins, right peroneal veins, right gastrinomas veins).  Patient started on Xarelto and received her first dose of 15 mg in the clinic. Patient denies any shortness of breath, no chest pain, no fever, no chills, no nausea, no vomiting, no abdominal pain, no diarrhea, no constipation, no melena, no hematemesis, no hematochezia, no syncope, no lightheadedness, no weakness, no  visual changes, no asymmetric weakness or numbness.  Patient however does endorse decreased oral intake over the past few days.  Patient states she noted blood pressure when as low as [id number] systolic.  Patient denies any recent surgeries, no prior history of DVT/PE, no recent long car rides or travel, no recent immobilization.  Patient states relatively active. COVID-19 PCR done at PCPs office was negative on 12/03/2019.     ED Course: Patient seen in the ED, basic metabolic profile obtained with a glucose of 112, BUN of 27, creatinine of 1.33 otherwise was within normal limits.  CBC was unremarkable.  CT angiogram chest with an acute nonocclusive segmental pulmonary emboli involving the right lower lobe, no CT evidence of heart strain.  CT abdomen and pelvis showed extensive nonocclusive thrombus involving right common femoral vein with extension into the right external and common iliac veins, extensive nonocclusive thrombus involving the IVC to the level of the left renal vein.  Indeterminate mass centered within the uterine fundus measuring approximately 3.6 cm.  Follow-up with nonemergent pelvic ultrasound recommended.  Large left-sided heterogeneous thyroid nodule measuring at least 3.2 cm.  And nonemergent outpatient thyroid ultrasound recommended.  ED PA discussed case with Dr. Earleen Newport of interventional radiology and was felt given the extensiveness of patient's clot it was felt patient was a candidate for thromboembolectomy and recommended transfer to [facility] to be evaluated for thrombectomy hopefully to be done tomorrow and starting patient on anticoagulation.  Hypercoagulable panel obtained.  Patient started on IV heparin.  Review of Systems: As per HPI otherwise all other systems reviewed and are negative.  Past Medical  History:  Diagnosis Date  . Allergic reaction to inhaled pollen   . Allergy   . Hyperlipidemia   . Hypertension   . Osteoarthritis, generalized   . Other osteoporosis   .  Skin cancer, basal cell 1994   face    Past Surgical History:  Procedure Laterality Date  . AUGMENTATION MAMMAPLASTY Bilateral   . BREAST ENHANCEMENT SURGERY Bilateral 1973  . COLONOSCOPY    . FOOT SURGERY Bilateral    right 8/12, 11/12 for left. Dr Milinda Pointer  . POLYPECTOMY    . TONSILLECTOMY N/A 1970  . TUBAL LIGATION      Social History  reports that she has quit smoking. Her smoking use included cigarettes. She has never used smokeless tobacco. She reports that she does not drink alcohol or use drugs.  Allergies  Allergen Reactions  . Codeine     "makes me feel hyper"    Family History  Problem Relation Age of Onset  . Heart disease Mother   . Heart disease Father   . Diabetes Father   . Hypertension Father   . Stroke Sister   . Heart disease Brother   . Cancer Maternal Grandfather        colon  . Colon cancer Maternal Grandfather 8  . Cancer Other        various cancers  . Colon cancer Paternal Uncle 74  . Colon polyps Neg Hx   . Esophageal cancer Neg Hx   . Rectal cancer Neg Hx   . Stomach cancer Neg Hx    Patient states mother deceased age 37 with a history of coronary artery disease.  Father deceased age 32 from a massive heart attack.  Prior to Admission medications   Medication Sig Start Date End Date Taking? Authorizing Provider  BIOTIN PO Take 1 tablet by mouth.   Yes [provider]  cetirizine (ZYRTEC) 10 MG tablet Take 10 mg by mouth daily as needed for allergies.   Yes [provider]  fluticasone (FLONASE) 50 MCG/ACT nasal spray Place 2 sprays into both nostrils daily as needed for allergies or rhinitis.   Yes [provider]  Multiple Vitamins-Minerals (MULTIVITAL) CHEW Chew 1 tablet by mouth daily.   Yes [provider]  Probiotic Product (PROBIOTIC DAILY PO) Take 1 tablet by mouth.   Yes [provider]  simvastatin (ZOCOR) 40 MG tablet TAKE 1 TABLET (40 MG TOTAL) BY MOUTH AT BEDTIME. Patient taking  differently: Take 40 mg by mouth at bedtime.  11/30/16  Yes Venia Carbon, MD  potassium chloride (KLOR-CON) 10 MEQ tablet Take 10 mEq by mouth daily. 12/02/19   [provider]    Physical Exam: Vitals:   12/03/19 1800 12/03/19 1801 12/03/19 1815 12/03/19 1917  BP: 121/85  (!) 122/99 117/63  Pulse: (!) 107  (!) 105 96  Resp: 18  18 (!) 23  Temp:      TempSrc:      SpO2: 96%  96% 92%  Weight:  67.1 kg    Height:  5\' 4"  (1.626 m)      Constitutional: NAD, calm, comfortable Vitals:   12/03/19 1800 12/03/19 1801 12/03/19 1815 12/03/19 1917  BP: 121/85  (!) 122/99 117/63  Pulse: (!) 107  (!) 105 96  Resp: 18  18 (!) 23  Temp:      TempSrc:      SpO2: 96%  96% 92%  Weight:  67.1 kg    Height:  5\' 4"  (1.626  m)     Eyes: PERRL, lids and conjunctivae normal ENMT: Mucous membranes are dry. Posterior pharynx clear of any exudate or lesions.Normal dentition.  Neck: normal, supple, no masses, no thyromegaly Respiratory: clear to auscultation bilaterally, no wheezing, no crackles. Normal respiratory effort. No accessory muscle use.  Cardiovascular: Regular rate and rhythm, no murmurs / rubs / gallops. No extremity edema. 2+ pedal pulses. No carotid bruits.  Abdomen: no tenderness, no masses palpated. No hepatosplenomegaly. Bowel sounds positive.  Musculoskeletal: no clubbing / cyanosis. No joint deformity upper and lower extremities. Good ROM, no contractures. Normal muscle tone.  Right lower extremity swelling, slight warmth, slight erythema. Skin: no rashes, lesions, ulcers. No induration Neurologic: CN 2-12 grossly intact. Sensation intact, DTR normal. Strength 5/5 in all 4.  Psychiatric: Normal judgment and insight. Alert and oriented x 3. Normal mood.  Labs on Admission: I have personally reviewed following labs and imaging studies  CBC: Recent Labs  Lab 12/03/19 1449  WBC 9.7  NEUTROABS 6.9  HGB 12.8  HCT 39.3  MCV 97.0  PLT 411*    Basic Metabolic  Panel: Recent Labs  Lab 12/03/19 1449  NA 137  K 3.6  CL 98  CO2 26  GLUCOSE 112*  BUN 27*  CREATININE 1.33*  CALCIUM 9.9    GFR: Estimated Creatinine Clearance: 36.6 mL/min (A) (by C-G formula based on SCr of 1.33 mg/dL (H)).  Liver Function Tests: No results for input(s): AST, ALT, ALKPHOS, BILITOT, PROT, ALBUMIN in the last 168 hours.  Urine analysis: No results found for: COLORURINE, APPEARANCEUR, LABSPEC, PHURINE, GLUCOSEU, HGBUR, BILIRUBINUR, KETONESUR, PROTEINUR, UROBILINOGEN, NITRITE, LEUKOCYTESUR  Radiological Exams on Admission: CT Angio Chest PE W and/or Wo Contrast  Result Date: 12/03/2019 CLINICAL DATA:  Shortness of breath and decreased appetite. Known lower extremity DVT. EXAM: CT ANGIOGRAPHY CHEST CT ABDOMEN AND PELVIS WITH CONTRAST TECHNIQUE: Multidetector CT imaging of the chest was performed using the standard protocol during bolus administration of intravenous contrast. Multiplanar CT image reconstructions and MIPs were obtained to evaluate the vascular anatomy. Multidetector CT imaging of the abdomen and pelvis was performed using the standard protocol during bolus administration of intravenous contrast. CONTRAST:  20mL OMNIPAQUE IOHEXOL 350 MG/ML SOLN COMPARISON:  None. FINDINGS: CTA CHEST FINDINGS Cardiovascular: Contrast injection is sufficient to demonstrate satisfactory opacification of the pulmonary arteries to the segmental level.There is an acute appearing segmental pulmonary embolus involving the right lower lobe (axial series 1, image 86). The main pulmonary artery is within normal limits for size. There is no CT evidence of acute right heart strain. There are atherosclerotic changes of the thoracic aorta without evidence for an aneurysm or dissection. Heart size is normal, without pericardial effusion. Mediastinum/Nodes: --No mediastinal or hilar lymphadenopathy. --No axillary lymphadenopathy. --No supraclavicular lymphadenopathy. --the left thyroid gland is  enlarged with a dominant left-sided thyroid nodule that appears to be a measuring at least 3.2 cm. --The esophagus is unremarkable Lungs/Pleura: There are mild emphysematous changes bilaterally. There is no pneumothorax. No significant pleural effusion. The trachea is unremarkable. Musculoskeletal: No chest wall abnormality. No acute or significant osseous findings. Appears to be at least 1 left-sided breast nodule measuring approximately 1.2 cm (axial series 4, image 138). This was likely previously evaluated on the patient's recent screening mammogram and as such no further follow-up is required. Review of the MIP images confirms the above findings. CT ABDOMEN and PELVIS FINDINGS Hepatobiliary: The liver is normal. Normal gallbladder.There is no biliary ductal dilation. Pancreas: Normal contours without ductal dilatation.  No peripancreatic fluid collection. Spleen: Unremarkable. Adrenals/Urinary Tract: --Adrenal glands: Unremarkable. --Right kidney/ureter: No hydronephrosis or radiopaque kidney stones. --Left kidney/ureter: Multiple cortical cysts are noted. There is no hydronephrosis. --Urinary bladder: Unremarkable. Stomach/Bowel: --Stomach/Duodenum: No hiatal hernia or other gastric abnormality. Normal duodenal course and caliber. --Small bowel: Unremarkable. --Colon: Rectosigmoid diverticulosis without acute inflammation. --Appendix: Normal. Vascular/Lymphatic: Atherosclerotic calcification is present within the non-aneurysmal abdominal aorta, without hemodynamically significant stenosis. There is extensive nonocclusive thrombus from the right common femoral vein extending into the right external and common iliac veins into the IVC. The thrombus terminates at the level of the left renal vein. --No retroperitoneal lymphadenopathy. --No mesenteric lymphadenopathy. --No pelvic or inguinal lymphadenopathy. Reproductive: There is an indeterminate mass that appears to be centered within the uterine fundus measuring  approximately 3.4 x 3.6 cm. Other: No ascites or free air. The abdominal wall is normal. Musculoskeletal. No acute displaced fractures. 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 There is a chronic appearing compression fracture of the T12 vertebral body. Review of the MIP images confirms the above findings. IMPRESSION: 1. There is an acute nonocclusive segmental pulmonary embolus involving the right lower lobe. There is no CT evidence for heart strain. 2. Extensive nonocclusive thrombus is noted involving the right common femoral vein with extension into the right external and common iliac veins. There is extensive nonocclusive thrombus involving the IVC to the level of the left renal vein. 3. Indeterminate mass centered within the uterine fundus measuring approximately 3.6 cm. Follow-up with a nonemergent pelvic ultrasound is recommended for further evaluation of this finding. 4. Large left-sided heterogeneous thyroid nodule measuring at least 3.2 cm. A nonemergent outpatient thyroid ultrasound is recommended for further evaluation of this finding.(Ref: J Am Coll Radiol. 2015 Feb;12(2): 143-50). Aortic Atherosclerosis (ICD10-I70.0) and Emphysema (ICD10-J43.9). These results were called by telephone at the time of interpretation on 12/03/2019 at 5:06 pm to provider [facility] , who verbally acknowledged these results. Electronically Signed   By: Constance Holster M.D.   On: 12/03/2019 17:19   CT ABDOMEN PELVIS W CONTRAST  Result Date: 12/03/2019 CLINICAL DATA:  Shortness of breath and decreased appetite. Known lower extremity DVT. EXAM: CT ANGIOGRAPHY CHEST CT ABDOMEN AND PELVIS WITH CONTRAST TECHNIQUE: Multidetector CT imaging of the chest was performed using the standard protocol during bolus administration of intravenous contrast. Multiplanar CT image reconstructions and MIPs were obtained to evaluate the vascular anatomy. Multidetector CT imaging of the abdomen and pelvis was performed using the  standard protocol during bolus administration of intravenous contrast. CONTRAST:  34mL OMNIPAQUE IOHEXOL 350 MG/ML SOLN COMPARISON:  None. FINDINGS: CTA CHEST FINDINGS Cardiovascular: Contrast injection is sufficient to demonstrate satisfactory opacification of the pulmonary arteries to the segmental level.There is an acute appearing segmental pulmonary embolus involving the right lower lobe (axial series 1, image 86). The main pulmonary artery is within normal limits for size. There is no CT evidence of acute right heart strain. There are atherosclerotic changes of the thoracic aorta without evidence for an aneurysm or dissection. Heart size is normal, without pericardial effusion. Mediastinum/Nodes: --No mediastinal or hilar lymphadenopathy. --No axillary lymphadenopathy. --No supraclavicular lymphadenopathy. --the left thyroid gland is enlarged with a dominant left-sided thyroid nodule that appears to be a measuring at least 3.2 cm. --The esophagus is unremarkable Lungs/Pleura: There are mild emphysematous changes bilaterally. There is no pneumothorax. No significant pleural effusion. The trachea is unremarkable. Musculoskeletal: No chest wall abnormality. No acute or significant osseous findings. Appears to be  at least 1 left-sided breast nodule measuring approximately 1.2 cm (axial series 4, image 138). This was likely previously evaluated on the patient's recent screening mammogram and as such no further follow-up is required. Review of the MIP images confirms the above findings. CT ABDOMEN and PELVIS FINDINGS Hepatobiliary: The liver is normal. Normal gallbladder.There is no biliary ductal dilation. Pancreas: Normal contours without ductal dilatation. No peripancreatic fluid collection. Spleen: Unremarkable. Adrenals/Urinary Tract: --Adrenal glands: Unremarkable. --Right kidney/ureter: No hydronephrosis or radiopaque kidney stones. --Left kidney/ureter: Multiple cortical cysts are noted. There is no  hydronephrosis. --Urinary bladder: Unremarkable. Stomach/Bowel: --Stomach/Duodenum: No hiatal hernia or other gastric abnormality. Normal duodenal course and caliber. --Small bowel: Unremarkable. --Colon: Rectosigmoid diverticulosis without acute inflammation. --Appendix: Normal. Vascular/Lymphatic: Atherosclerotic calcification is present within the non-aneurysmal abdominal aorta, without hemodynamically significant stenosis. There is extensive nonocclusive thrombus from the right common femoral vein extending into the right external and common iliac veins into the IVC. The thrombus terminates at the level of the left renal vein. --No retroperitoneal lymphadenopathy. --No mesenteric lymphadenopathy. --No pelvic or inguinal lymphadenopathy. Reproductive: There is an indeterminate mass that appears to be centered within the uterine fundus measuring approximately 3.4 x 3.6 cm. Other: No ascites or free air. The abdominal wall is normal. Musculoskeletal. No acute displaced fractures. 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 There is a chronic appearing compression fracture of the T12 vertebral body. Review of the MIP images confirms the above findings. IMPRESSION: 1. There is an acute nonocclusive segmental pulmonary embolus involving the right lower lobe. There is no CT evidence for heart strain. 2. Extensive nonocclusive thrombus is noted involving the right common femoral vein with extension into the right external and common iliac veins. There is extensive nonocclusive thrombus involving the IVC to the level of the left renal vein. 3. Indeterminate mass centered within the uterine fundus measuring approximately 3.6 cm. Follow-up with a nonemergent pelvic ultrasound is recommended for further evaluation of this finding. 4. Large left-sided heterogeneous thyroid nodule measuring at least 3.2 cm. A nonemergent outpatient thyroid ultrasound is recommended for further evaluation of this finding.(Ref: J Am Coll  Radiol. 2015 Feb;12(2): 143-50). Aortic Atherosclerosis (ICD10-I70.0) and Emphysema (ICD10-J43.9). These results were called by telephone at the time of interpretation on 12/03/2019 at 5:06 pm to provider [facility] , who verbally acknowledged these results. Electronically Signed   By: Constance Holster M.D.   On: 12/03/2019 17:19   VAS [place] LOWER EXTREMITY VENOUS (DVT)  Result Date: 12/03/2019  Lower Venous DVTStudy Indications: Pain, and Swelling.  Risk Factors: None identified. Comparison Study: No prior studies. Performing Technologist: Oliver Hum RVT  Examination Guidelines: A complete evaluation includes B-mode imaging, spectral Doppler, color Doppler, and power Doppler as needed of all accessible portions of each vessel. Bilateral testing is considered an integral part of a complete examination. Limited examinations for reoccurring indications may be performed as noted. The reflux portion of the exam is performed with the patient in reverse Trendelenburg.  +---------+---------------+---------+-----------+----------+--------------+ RIGHT    CompressibilityPhasicitySpontaneityPropertiesThrombus Aging +---------+---------------+---------+-----------+----------+--------------+ CFV      None           No       No                   Acute          +---------+---------------+---------+-----------+----------+--------------+ SFJ      None  Acute          +---------+---------------+---------+-----------+----------+--------------+ FV Prox  None           No       No                   Acute          +---------+---------------+---------+-----------+----------+--------------+ FV Mid   None           No       No                   Acute          +---------+---------------+---------+-----------+----------+--------------+ FV DistalNone           No       No                   Acute           +---------+---------------+---------+-----------+----------+--------------+ PFV      None                                         Acute          +---------+---------------+---------+-----------+----------+--------------+ POP      None           No       No                   Acute          +---------+---------------+---------+-----------+----------+--------------+ PTV      None                                         Acute          +---------+---------------+---------+-----------+----------+--------------+ PERO     None                                         Acute          +---------+---------------+---------+-----------+----------+--------------+ Gastroc  None                                         Acute          +---------+---------------+---------+-----------+----------+--------------+ EIV      None           No       No                   Acute          +---------+---------------+---------+-----------+----------+--------------+ CIV      Full                                                        +---------+---------------+---------+-----------+----------+--------------+   +----+---------------+---------+-----------+----------+--------------+ LEFTCompressibilityPhasicitySpontaneityPropertiesThrombus Aging +----+---------------+---------+-----------+----------+--------------+ CFV Full           Yes      Yes                                 +----+---------------+---------+-----------+----------+--------------+  Summary: RIGHT: - Findings consistent with acute deep vein thrombosis involving the right external iliac vein, common femoral vein, SF junction, right femoral vein, right proximal profunda vein, right popliteal vein, right posterior tibial veins, right peroneal veins, and right gastrocnemius veins. - No cystic structure found in the popliteal fossa.  LEFT: - No evidence of common femoral vein obstruction.  *See table(s) above for measurements  and observations.    Preliminary     EKG: Not done.  Assessment/Plan Principal Problem:   DVT (deep venous thrombosis) (HCC) Active Problems:   Hypertension   Hyperlipidemia   Osteoporosis   Allergic rhinitis due to pollen   Pulmonary emboli (HCC)   Mass of uterus: pER ct ABD/PELVIS 12/03/2019   Thyroid nodule   1 extensive right lower extremity DVT (right common femoral vein with extension into right external and common iliac veins.  Extensive nonocclusive thrombus involving the IVC to the level of the left renal vein)/acute nonocclusive segmental pulmonary embolus involving right lower lobe Questionable etiology.  Patient with an unprovoked DVT and PE.  Patient denies any recent car travel, no recent surgeries, no recent immobility, no recent prior history of DVT.  Patient however states grandmother had a history of DVT.  Patient stated had a recent colonoscopy done 03/24/2019 that had 2 sessile polyps in the transverse colon status post resection, 5 mm sessile polyp in the sigmoid colon status post resection, diverticulosis, internal hemorrhoids.  Biopsies from polyps were tubular adenoma, benign colon mucosa, no high-grade dysplasia or malignancy.  Hyperplastic polyp with no high-grade dysplasia or malignancy.  CT abdomen and pelvis which was done concerning for uterine mass.  Due to extensive PE concern for possible malignancy.  Will order pelvic ultrasound.  Hypercoagulable panel has already been obtained in the ED.  Patient has been started on IV heparin.  IR has been consulted and requesting patient be transferred to [facility] for evaluation for thrombectomy.  IV fluids.  Supportive care.  Will make patient n.p.o. after midnight.  2.  Uterine mass Noted on CT abdomen and pelvis.  Pelvic ultrasound for further evaluation.  3.  Thyroid nodule Thyroid ultrasound recommended.  4.  Hypertension Patient noted to have bouts of recent hypotension and as such we will hold antihypertensive  medications at this time.  Blood pressure currently stable.  Placed on gentle hydration.  Follow.  5.  Hyperlipidemia Continue statin.  6.  Allergic rhinitis Continue home regimen of Claritin, Flonase.  DVT prophylaxis: Heparin Code Status:   Full Family Communication: Updated patient and husband at bedside. Disposition Plan:   Patient is from:  Home  Anticipated DC to:  Home  Anticipated DC date:  To be determined.  Anticipated DC barriers: Pending thrombectomy.  Consults called:  Interventional radiology: Dr. Earleen Newport Admission status:  Admit to inpatient at [facility] unit  Severity of Illness: The appropriate patient status for this patient is INPATIENT. Inpatient status is judged to be reasonable and necessary in order to provide the required intensity of service to ensure the patient's safety. The patient's presenting symptoms, physical exam findings, and initial radiographic and laboratory data in the context of their chronic comorbidities is felt to place them at high risk for further clinical deterioration. Furthermore, it is not anticipated that the patient will be medically stable for discharge from the hospital within 2 midnights of admission. The following factors support the patient status of inpatient.   "           The  patient's presenting symptoms include  hypotension, right lower extremity swelling and pain, recent diagnosis of DVT.   "           The worrisome physical exam findings include  extensive right lower extremity DVT in the setting of hypotension in the outpatient setting, acute PE with patient at high risk for decompensation, urine mass. "           The initial radiographic and laboratory data are worrisome because of  extensive right lower extremity DVT, PE, presentation from PCPs office with hypotension.  "           The chronic co-morbidities include hypertension   * I certify that at the point of admission it is my clinical judgment that  the patient will require inpatient hospital care spanning beyond 2 midnights from the point of admission due to high intensity of service, high risk for further deterioration and high frequency of surveillance required.*     Irine Seal MD [facility]  How to contact the [facility] Attending or Consulting provider [place] or covering provider during after hours [place], for this patient?   1. Check the care team in [facility] and look for a) attending/consulting [facility] provider listed and b) the [facility] team listed 2. Log into [email or web address] and use [place]'s universal password to access. If you do not have the password, please contact the hospital operator. 3. Locate the [facility] provider you are looking for under [facility] and page to a number that you can be directly reached. 4. If you still have difficulty reaching the provider, please page the [facility] (Director on Call) for the Hospitalists listed on amion for assistance.  12/03/2019, 7:31 PM

## 2019-12-03 NOTE — Progress Notes (Signed)
[place] for IV heparin Indication: PE/DVT  Allergies  Allergen Reactions  . Codeine     "makes me feel hyper"    Patient Measurements: Height: 5\' 4"  (162.6 cm) Weight: 67.1 kg (148 lb) IBW/kg (Calculated) : 54.7 Heparin Dosing Weight: TBW  Vital Signs: Temp: 97.9 F (36.6 C) (05/05 1430) Temp Source: Oral (05/05 1430) BP: 122/99 (05/05 1815) Pulse Rate: 105 (05/05 1815)  Labs: Recent Labs    12/03/19 1449  HGB 12.8  HCT 39.3  PLT 411*  CREATININE 1.33*    Estimated Creatinine Clearance: 36.6 mL/min (A) (by C-G formula based on SCr of 1.33 mg/dL (H)).   Medical History: Past Medical History:  Diagnosis Date  . Allergic reaction to inhaled pollen   . Allergy   . Hyperlipidemia   . Hypertension   . Osteoarthritis, generalized   . Other osteoporosis   . Skin cancer, basal cell 1994   face    Medications:  (Not in a hospital admission)  Scheduled:  . heparin  4,000 Units Intravenous Once  . sodium chloride (PF)       Infusions:  . sodium chloride 100 mL/hr at 12/03/19 1917  . heparin 800 Units/hr (12/03/19 1918)    Assessment: 15 yoF with PMH HTN, HLD, family Hx DVT, seen at PCP office this AM for lower extremity pain/swelling, and diagnosed with new DVT. Given dose of Xarelto 15 mg and sent to ED due to concern for PE. CTA did confirm nonocclusive segmental PE in RLL, without evidence of RH strain. EDP discussed case with IR; given extensiveness of clot, planning for transfer to [facility] for thromboembolectomy. Pharmacy to dose IV heparin - IR MD specifically requested heparin start with bolus irrespective of timing of Xarelto dose, so verified dose entered by EDP.   Baseline aPTT, heparin level: pending  Prior anticoagulation: Xarelto 15 mg x 1 at 1400 on 5/5  Significant events:  Today, 12/03/2019:  CBC: WNL  No bleeding or infusion issues per nursing  SCr slightly elevated, no recent baseline  Goal of  Therapy: Heparin level 0.3-0.7 units/ml Monitor platelets by anticoagulation protocol: Yes  Plan:  Heparin 4000 unit bolus and 800 units/hr rate ordered per EDP after discussion with IR MD; although lower than usual VTE dosing, given overlap with Xarelto I am inclined to continue this lower dose for now  Will check 8-hr aPTT (which is incidentally ~12 hr after Xarelto dose) and adjust heparin accordingly (without bolus)  Daily heparin level and CBC; aPTT as needed while heparin level remains elevated d/t recent Xarelto  Monitor for signs of bleeding or thrombosis  Reuel Boom, PharmD, BCPS [phone] 12/03/2019, 7:19 PM

## 2019-12-03 NOTE — ED Triage Notes (Signed)
Patient brought in by husband   Patient sent over by [facility] for further evaluation and possible CTA.   DVT of iliac vein of right lower extremity  C/o hypotension and told to hold all BP medications   First dose of Xerelto 15 mg given at clinic per paper work with patient     Hx. Allergies   Denies shob

## 2019-12-03 NOTE — ED Notes (Signed)
[facility] called for transport. 

## 2019-12-03 NOTE — Progress Notes (Signed)
Right lower extremity venous duplex has been completed. Preliminary results can be found in CV Proc through chart review.  Results were given to Dr. Laurann Montana.  12/03/19 11:06 AM Belinda Perry RVT

## 2019-12-03 NOTE — ED Provider Notes (Signed)
[facility] DEPT Provider Note   CSN: [id number] Arrival date & time: 12/03/19  1424     History Chief Complaint  Patient presents with   DVT    right   Leg Swelling    Belinda Perry is a 74 y.o. female past history of hypertension, hyperlipidemia, skin cancer (1994) sent in by PCP for evaluation of possible PE.  Patient diagnosed with a DVT of her right lower extremity this morning at an outpatient facility.  Patient reported that for about the last 2-1/2 weeks, she has had some right lower extremity pain, swelling.  She saw her PCP and he had a D-dimer ordered as well as some blood work.  She had an ultrasound done today that was positive for right-sided DVT.  Patient had been having some hypotension and had had blood pressures in the 80s at her primary care doctor's office.  She had previously had a history of hypertension and had been on medications.  He had tried to adjust her medications but still was low blood pressure.  Her D-dimer came back elevated at 16 and in combination of the low blood pressure, he sent her over for CTA of her chest to ensure that there is no evidence of PE.  Patient states she has not had any chest pain or difficulty breathing.  She does report that she has had some decreased appetite over the last few weeks. She denies any OCP use, recent immobilization, prior history of DVT/PE, recent surgery, leg swelling, or long travel.  She does state that she is fairly active.  She had her first dose of Xarelto today.  Patient denies any fevers, chest pain, difficulty breathing, abdominal pain, nausea/vomiting.   The history is provided by the patient.       Past Medical History:  Diagnosis Date   Allergic reaction to inhaled pollen    Allergy    Hyperlipidemia    Hypertension    Osteoarthritis, generalized    Other osteoporosis    Skin cancer, basal cell 1994   face    Patient Active Problem List   Diagnosis Date Noted     DVT (deep venous thrombosis) ([place]) 12/03/2019   Pulmonary emboli ([place]) 12/03/2019   Mass of uterus: pER ct ABD/PELVIS 12/03/2019 12/03/2019   Thyroid nodule 12/03/2019   Advance directive discussed with patient 11/23/2014   Routine general medical examination at a health care facility 11/19/2013   Hypertension    Hyperlipidemia    Osteoporosis    Allergic rhinitis due to pollen    Osteoarthritis, generalized     Past Surgical History:  Procedure Laterality Date   AUGMENTATION MAMMAPLASTY Bilateral    BREAST ENHANCEMENT SURGERY Bilateral 1973   COLONOSCOPY     FOOT SURGERY Bilateral    right 8/12, 11/12 for left. Dr Milinda Pointer   POLYPECTOMY     TONSILLECTOMY N/A 1970   TUBAL LIGATION       OB History   No obstetric history on file.     Family History  Problem Relation Age of Onset   Heart disease Mother    Heart disease Father    Diabetes Father    Hypertension Father    Stroke Sister    Heart disease Brother    Cancer Maternal Grandfather        colon   Colon cancer Maternal Grandfather 31   Cancer Other        various cancers   Colon cancer Paternal Uncle 47  Colon polyps Neg Hx    Esophageal cancer Neg Hx    Rectal cancer Neg Hx    Stomach cancer Neg Hx     Social History   Tobacco Use   Smoking status: Former Smoker    Types: Cigarettes   Smokeless tobacco: Never Used  Substance Use Topics   Alcohol use: No   Drug use: No    Home Medications Prior to Admission medications   Medication Sig Start Date End Date Taking? Authorizing Provider  BIOTIN PO Take 1 tablet by mouth.   Yes [provider]  cetirizine (ZYRTEC) 10 MG tablet Take 10 mg by mouth daily as needed for allergies.   Yes [provider]  fluticasone (FLONASE) 50 MCG/ACT nasal spray Place 2 sprays into both nostrils daily as needed for allergies or rhinitis.   Yes [provider]  Multiple Vitamins-Minerals (MULTIVITAL) CHEW  Chew 1 tablet by mouth daily.   Yes [provider]  Probiotic Product (PROBIOTIC DAILY PO) Take 1 tablet by mouth.   Yes [provider]  simvastatin (ZOCOR) 40 MG tablet TAKE 1 TABLET (40 MG TOTAL) BY MOUTH AT BEDTIME. Patient taking differently: Take 40 mg by mouth at bedtime.  11/30/16  Yes Venia Carbon, MD  potassium chloride (KLOR-CON) 10 MEQ tablet Take 10 mEq by mouth daily. 12/02/19   [provider]    Allergies    Codeine  Review of Systems   Review of Systems  Constitutional: Positive for appetite change. Negative for fever.  Respiratory: Negative for cough and shortness of breath.   Cardiovascular: Positive for leg swelling. Negative for chest pain.  Gastrointestinal: Negative for abdominal pain, nausea and vomiting.  Genitourinary: Negative for dysuria and hematuria.  Skin: Positive for color change.  Neurological: Negative for headaches.  All other systems reviewed and are negative.   Physical Exam Updated Vital Signs BP 117/63    Pulse 96    Temp 97.9 F (36.6 C) (Oral)    Resp (!) 23    Ht 5\' 4"  (1.626 m)    Wt 67.1 kg    SpO2 92%    BMI 25.40 kg/m   Physical Exam Vitals and nursing note reviewed.  Constitutional:      Appearance: Normal appearance. She is well-developed.  HENT:     Head: Normocephalic and atraumatic.  Eyes:     General: Lids are normal.     Conjunctiva/sclera: Conjunctivae normal.     Pupils: Pupils are equal, round, and reactive to light.  Cardiovascular:     Rate and Rhythm: Normal rate and regular rhythm.     Pulses: Normal pulses.          Dorsalis pedis pulses are 2+ on the right side and 2+ on the left side.     Heart sounds: Normal heart sounds. No murmur. No friction rub. No gallop.   Pulmonary:     Effort: Pulmonary effort is normal.     Breath sounds: Normal breath sounds.     Comments: Lungs clear to auscultation bilaterally.  Symmetric chest rise.  No wheezing, rales, rhonchi. Abdominal:      Palpations: Abdomen is soft. Abdomen is not rigid.     Tenderness: There is no abdominal tenderness. There is no guarding.     Comments: Abdomen is soft, non-distended, non-tender. No rigidity, No guarding. No peritoneal signs.  Musculoskeletal:        General: Normal range of motion.     Cervical back: Full  passive range of motion without pain.     Comments: Right lower extremity is more edematous than left lower extremity.  Nonpitting edema noted to the mid thigh that extends distally where she has about 1-2+ pitting edema noted to the lower tib-fib area.  There is some mild overlying erythema noted to the anterior tib-fib aspect but no warmth.  Skin:    General: Skin is warm and dry.     Capillary Refill: Capillary refill takes less than 2 seconds.     Comments: Good distal cap refill. RLE is not dusky in appearance or cool to touch.  Neurological:     Mental Status: She is alert and oriented to person, place, and time.     Comments: Sensation intact along major nerve distributions of BLE  Psychiatric:        Speech: Speech normal.     ED Results / Procedures / Treatments   Labs (all labs ordered are listed, but only abnormal results are displayed) Labs Reviewed  BASIC METABOLIC PANEL - Abnormal; Notable for the following components:      Result Value   Glucose, Bld 112 (*)    BUN 27 (*)    Creatinine, Ser 1.33 (*)    GFR calc non Af Amer 40 (*)    GFR calc Af Amer 46 (*)    All other components within normal limits  CBC WITH DIFFERENTIAL/PLATELET - Abnormal; Notable for the following components:   Platelets 411 (*)    All other components within normal limits  RESPIRATORY PANEL BY RT PCR (FLU A&B, COVID)  ANTITHROMBIN III  PROTEIN C ACTIVITY  PROTEIN C, TOTAL  PROTEIN S ACTIVITY  PROTEIN S, TOTAL  LUPUS ANTICOAGULANT PANEL  BETA-2-GLYCOPROTEIN I ABS, IGG/M/A  HOMOCYSTEINE  FACTOR 5 LEIDEN  PROTHROMBIN GENE MUTATION  CARDIOLIPIN ANTIBODIES, IGG, IGM, IGA     EKG None  Radiology CT Angio Chest PE W and/or Wo Contrast  Result Date: 12/03/2019 CLINICAL DATA:  Shortness of breath and decreased appetite. Known lower extremity DVT. EXAM: CT ANGIOGRAPHY CHEST CT ABDOMEN AND PELVIS WITH CONTRAST TECHNIQUE: Multidetector CT imaging of the chest was performed using the standard protocol during bolus administration of intravenous contrast. Multiplanar CT image reconstructions and MIPs were obtained to evaluate the vascular anatomy. Multidetector CT imaging of the abdomen and pelvis was performed using the standard protocol during bolus administration of intravenous contrast. CONTRAST:  56mL OMNIPAQUE IOHEXOL 350 MG/ML SOLN COMPARISON:  None. FINDINGS: CTA CHEST FINDINGS Cardiovascular: Contrast injection is sufficient to demonstrate satisfactory opacification of the pulmonary arteries to the segmental level.There is an acute appearing segmental pulmonary embolus involving the right lower lobe (axial series 1, image 86). The main pulmonary artery is within normal limits for size. There is no CT evidence of acute right heart strain. There are atherosclerotic changes of the thoracic aorta without evidence for an aneurysm or dissection. Heart size is normal, without pericardial effusion. Mediastinum/Nodes: --No mediastinal or hilar lymphadenopathy. --No axillary lymphadenopathy. --No supraclavicular lymphadenopathy. --the left thyroid gland is enlarged with a dominant left-sided thyroid nodule that appears to be a measuring at least 3.2 cm. --The esophagus is unremarkable Lungs/Pleura: There are mild emphysematous changes bilaterally. There is no pneumothorax. No significant pleural effusion. The trachea is unremarkable. Musculoskeletal: No chest wall abnormality. No acute or significant osseous findings. Appears to be at least 1 left-sided breast nodule measuring approximately 1.2 cm (axial series 4, image 138). This was likely previously evaluated on the patient's recent  screening mammogram  and as such no further follow-up is required. Review of the MIP images confirms the above findings. CT ABDOMEN and PELVIS FINDINGS Hepatobiliary: The liver is normal. Normal gallbladder.There is no biliary ductal dilation. Pancreas: Normal contours without ductal dilatation. No peripancreatic fluid collection. Spleen: Unremarkable. Adrenals/Urinary Tract: --Adrenal glands: Unremarkable. --Right kidney/ureter: No hydronephrosis or radiopaque kidney stones. --Left kidney/ureter: Multiple cortical cysts are noted. There is no hydronephrosis. --Urinary bladder: Unremarkable. Stomach/Bowel: --Stomach/Duodenum: No hiatal hernia or other gastric abnormality. Normal duodenal course and caliber. --Small bowel: Unremarkable. --Colon: Rectosigmoid diverticulosis without acute inflammation. --Appendix: Normal. Vascular/Lymphatic: Atherosclerotic calcification is present within the non-aneurysmal abdominal aorta, without hemodynamically significant stenosis. There is extensive nonocclusive thrombus from the right common femoral vein extending into the right external and common iliac veins into the IVC. The thrombus terminates at the level of the left renal vein. --No retroperitoneal lymphadenopathy. --No mesenteric lymphadenopathy. --No pelvic or inguinal lymphadenopathy. Reproductive: There is an indeterminate mass that appears to be centered within the uterine fundus measuring approximately 3.4 x 3.6 cm. Other: No ascites or free air. The abdominal wall is normal. Musculoskeletal. No acute displaced fractures. 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 There is a chronic appearing compression fracture of the T12 vertebral body. Review of the MIP images confirms the above findings. IMPRESSION: 1. There is an acute nonocclusive segmental pulmonary embolus involving the right lower lobe. There is no CT evidence for heart strain. 2. Extensive nonocclusive thrombus is noted involving the right common  femoral vein with extension into the right external and common iliac veins. There is extensive nonocclusive thrombus involving the IVC to the level of the left renal vein. 3. Indeterminate mass centered within the uterine fundus measuring approximately 3.6 cm. Follow-up with a nonemergent pelvic ultrasound is recommended for further evaluation of this finding. 4. Large left-sided heterogeneous thyroid nodule measuring at least 3.2 cm. A nonemergent outpatient thyroid ultrasound is recommended for further evaluation of this finding.(Ref: J Am Coll Radiol. 2015 Feb;12(2): 143-50). Aortic Atherosclerosis (ICD10-I70.0) and Emphysema (ICD10-J43.9). These results were called by telephone at the time of interpretation on 12/03/2019 at 5:06 pm to provider [facility] , who verbally acknowledged these results. Electronically Signed   By: Constance Holster M.D.   On: 12/03/2019 17:19   CT ABDOMEN PELVIS W CONTRAST  Result Date: 12/03/2019 CLINICAL DATA:  Shortness of breath and decreased appetite. Known lower extremity DVT. EXAM: CT ANGIOGRAPHY CHEST CT ABDOMEN AND PELVIS WITH CONTRAST TECHNIQUE: Multidetector CT imaging of the chest was performed using the standard protocol during bolus administration of intravenous contrast. Multiplanar CT image reconstructions and MIPs were obtained to evaluate the vascular anatomy. Multidetector CT imaging of the abdomen and pelvis was performed using the standard protocol during bolus administration of intravenous contrast. CONTRAST:  44mL OMNIPAQUE IOHEXOL 350 MG/ML SOLN COMPARISON:  None. FINDINGS: CTA CHEST FINDINGS Cardiovascular: Contrast injection is sufficient to demonstrate satisfactory opacification of the pulmonary arteries to the segmental level.There is an acute appearing segmental pulmonary embolus involving the right lower lobe (axial series 1, image 86). The main pulmonary artery is within normal limits for size. There is no CT evidence of acute right heart strain.  There are atherosclerotic changes of the thoracic aorta without evidence for an aneurysm or dissection. Heart size is normal, without pericardial effusion. Mediastinum/Nodes: --No mediastinal or hilar lymphadenopathy. --No axillary lymphadenopathy. --No supraclavicular lymphadenopathy. --the left thyroid gland is enlarged with a dominant left-sided thyroid nodule that appears to be a measuring at  least 3.2 cm. --The esophagus is unremarkable Lungs/Pleura: There are mild emphysematous changes bilaterally. There is no pneumothorax. No significant pleural effusion. The trachea is unremarkable. Musculoskeletal: No chest wall abnormality. No acute or significant osseous findings. Appears to be at least 1 left-sided breast nodule measuring approximately 1.2 cm (axial series 4, image 138). This was likely previously evaluated on the patient's recent screening mammogram and as such no further follow-up is required. Review of the MIP images confirms the above findings. CT ABDOMEN and PELVIS FINDINGS Hepatobiliary: The liver is normal. Normal gallbladder.There is no biliary ductal dilation. Pancreas: Normal contours without ductal dilatation. No peripancreatic fluid collection. Spleen: Unremarkable. Adrenals/Urinary Tract: --Adrenal glands: Unremarkable. --Right kidney/ureter: No hydronephrosis or radiopaque kidney stones. --Left kidney/ureter: Multiple cortical cysts are noted. There is no hydronephrosis. --Urinary bladder: Unremarkable. Stomach/Bowel: --Stomach/Duodenum: No hiatal hernia or other gastric abnormality. Normal duodenal course and caliber. --Small bowel: Unremarkable. --Colon: Rectosigmoid diverticulosis without acute inflammation. --Appendix: Normal. Vascular/Lymphatic: Atherosclerotic calcification is present within the non-aneurysmal abdominal aorta, without hemodynamically significant stenosis. There is extensive nonocclusive thrombus from the right common femoral vein extending into the right external and  common iliac veins into the IVC. The thrombus terminates at the level of the left renal vein. --No retroperitoneal lymphadenopathy. --No mesenteric lymphadenopathy. --No pelvic or inguinal lymphadenopathy. Reproductive: There is an indeterminate mass that appears to be centered within the uterine fundus measuring approximately 3.4 x 3.6 cm. Other: No ascites or free air. The abdominal wall is normal. Musculoskeletal. No acute displaced fractures. 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 There is a chronic appearing compression fracture of the T12 vertebral body. Review of the MIP images confirms the above findings. IMPRESSION: 1. There is an acute nonocclusive segmental pulmonary embolus involving the right lower lobe. There is no CT evidence for heart strain. 2. Extensive nonocclusive thrombus is noted involving the right common femoral vein with extension into the right external and common iliac veins. There is extensive nonocclusive thrombus involving the IVC to the level of the left renal vein. 3. Indeterminate mass centered within the uterine fundus measuring approximately 3.6 cm. Follow-up with a nonemergent pelvic ultrasound is recommended for further evaluation of this finding. 4. Large left-sided heterogeneous thyroid nodule measuring at least 3.2 cm. A nonemergent outpatient thyroid ultrasound is recommended for further evaluation of this finding.(Ref: J Am Coll Radiol. 2015 Feb;12(2): 143-50). Aortic Atherosclerosis (ICD10-I70.0) and Emphysema (ICD10-J43.9). These results were called by telephone at the time of interpretation on 12/03/2019 at 5:06 pm to provider [facility] , who verbally acknowledged these results. Electronically Signed   By: Constance Holster M.D.   On: 12/03/2019 17:19   VAS [place] LOWER EXTREMITY VENOUS (DVT)  Result Date: 12/03/2019  Lower Venous DVTStudy Indications: Pain, and Swelling.  Risk Factors: None identified. Comparison Study: No prior studies. Performing  Technologist: Oliver Hum RVT  Examination Guidelines: A complete evaluation includes B-mode imaging, spectral Doppler, color Doppler, and power Doppler as needed of all accessible portions of each vessel. Bilateral testing is considered an integral part of a complete examination. Limited examinations for reoccurring indications may be performed as noted. The reflux portion of the exam is performed with the patient in reverse Trendelenburg.  +---------+---------------+---------+-----------+----------+--------------+  RIGHT     Compressibility Phasicity Spontaneity Properties Thrombus Aging  +---------+---------------+---------+-----------+----------+--------------+  CFV       None            No        No  Acute           +---------+---------------+---------+-----------+----------+--------------+  SFJ       None                                             Acute           +---------+---------------+---------+-----------+----------+--------------+  FV Prox   None            No        No                     Acute           +---------+---------------+---------+-----------+----------+--------------+  FV Mid    None            No        No                     Acute           +---------+---------------+---------+-----------+----------+--------------+  FV Distal None            No        No                     Acute           +---------+---------------+---------+-----------+----------+--------------+  PFV       None                                             Acute           +---------+---------------+---------+-----------+----------+--------------+  POP       None            No        No                     Acute           +---------+---------------+---------+-----------+----------+--------------+  PTV       None                                             Acute           +---------+---------------+---------+-----------+----------+--------------+  PERO      None                                              Acute           +---------+---------------+---------+-----------+----------+--------------+  Gastroc   None                                             Acute           +---------+---------------+---------+-----------+----------+--------------+  EIV       None            No        No  Acute           +---------+---------------+---------+-----------+----------+--------------+  CIV       Full                                                             +---------+---------------+---------+-----------+----------+--------------+   +----+---------------+---------+-----------+----------+--------------+  LEFT Compressibility Phasicity Spontaneity Properties Thrombus Aging  +----+---------------+---------+-----------+----------+--------------+  CFV  Full            Yes       Yes                                    +----+---------------+---------+-----------+----------+--------------+     Summary: RIGHT: - Findings consistent with acute deep vein thrombosis involving the right external iliac vein, common femoral vein, SF junction, right femoral vein, right proximal profunda vein, right popliteal vein, right posterior tibial veins, right peroneal veins, and right gastrocnemius veins. - No cystic structure found in the popliteal fossa.  LEFT: - No evidence of common femoral vein obstruction.  *See table(s) above for measurements and observations.    Preliminary     Procedures .Critical Care Performed by: Volanda Napoleon, PA-C Authorized by: Volanda Napoleon, PA-C   Critical care provider statement:    Critical care time (minutes):  35   Critical care was necessary to treat or prevent imminent or life-threatening deterioration of the following conditions: PE, DVT.   Critical care was time spent personally by me on the following activities:  Discussions with consultants, evaluation of patient's response to treatment, examination of patient, ordering and performing treatments and interventions,  ordering and review of laboratory studies, ordering and review of radiographic studies, pulse oximetry, re-evaluation of patient's condition, obtaining history from patient or surrogate and review of old charts   (including critical care time)  Medications Ordered in ED Medications  sodium chloride (PF) 0.9 % injection (has no administration in time range)  heparin ADULT infusion 100 units/mL (25000 units/259mL sodium chloride 0.45%) (800 Units/hr Intravenous New Bag/Given 12/03/19 1918)  0.9 %  sodium chloride infusion ( Intravenous New Bag/Given 12/03/19 1917)  iohexol (OMNIPAQUE) 350 MG/ML injection 100 mL (80 mLs Intravenous Contrast Given 12/03/19 1639)  heparin bolus via infusion 4,000 Units (4,000 Units Intravenous Bolus from Bag 12/03/19 1918)    ED Course  I have reviewed the triage vital signs and the nursing notes.  Pertinent labs & imaging results that were available during my care of the patient were reviewed by me and considered in my medical decision making (see chart for details).  Clinical Course as of Dec 02 1928  Wed May 05, 10255  2976 74 year old female here for clinic.  She was diagnosed with a DVT and put on anticoagulation but there was concern that she might of thrown a PE and wanted her to come here for eval.  She denies any chest pain or shortness of breath.  Getting labs and CT angio chest.  Disposition per results of testing.   [MB]    Clinical Course User Index [MB] Hayden Rasmussen, MD   MDM Rules/Calculators/A&P                      74 year old female who presents for evaluation  of known right DVT and sent over by PCP for concern of possible PE.  She has had 2.5 weeks of right lower extremity swelling.  Had an ultrasound today that showed a DVT.  Patient had had some hypotension and PCP sent her over for CTA of her chest for evaluation.  She had her first dose of Xarelto today.  On initial ED arrival, she is afebrile, nontoxic-appearing.  Vital signs are stable.   She is neurovascularly intact.  Right lower extremity does have obvious evidence of edema.  She does not have any risk factors for PE and states that she has been active.  She does report she has had some decreased appetite/may be some early satiety.  No active cancer.  We will plan for CTA chest for evaluation of PE.  Additionally, will add on CT on pelvis to ensure no other pathology.  Review of her ultrasound done earlier today shows findings consistent with acute deep vein thrombosis involving the right external iliac vein down into the right peroneal veins.  No evidence of left DVT.  CBC shows no leukocytosis or anemia.  BMP shows BUN of 27, creatinine of 1.33.  CT angio chest shows there is an acute nonocclusive segmental pulmonary embolus involving the right lower lobe.  No evidence of heart strain.  There is evidence of extensive nonocclusive thrombus noted involving the right common femoral vein with extension into the right external and common iliac veins.  This involves IVC to the level of the left renal vein.  There is also mention of an indeterminate mass in the uterus measuring 3.6 cm.  This could be fibroid but in the setting of unprovoked DVT, consider pelvic ultrasound for further evaluation.  I discussed with Dr. Earleen Newport (IR).  Given the extensiveness of the clot, he feels that patient is a candidate for for thromboembolectomy.  He recommends starting anticoagulation with medical admission.  He recommends transfer to [facility] so that patient can get a formal IR consult and they can proceed if patient wishes.   Updated patient on results.  She is agreeable and would like thromboembolectomy.  We will plan to start heparin.  Patient had Covid test done this morning at [facility] PCP.  We will plan to hold on new Covid test.  There is a picture of this in the media tab.  Discussed patient with Dr. Grandville Silos (hospitalist) who accepts patient for admission. He recommends obtaining hypercoagulable  studies for further eval.  Will add those orders.   Portions of this note were generated with [employer]. Dictation errors may occur despite best attempts at proofreading.  Final Clinical Impression(s) / ED Diagnoses Final diagnoses:  Acute deep vein thrombosis (DVT) of right lower extremity, unspecified vein (HCC)  Single subsegmental pulmonary embolism without acute cor pulmonale [facility])  Uterine mass    Rx / DC Orders ED Discharge Orders    None       Desma Mcgregor 12/03/19 1930    Hayden Rasmussen, MD 12/04/19 [phone]

## 2019-12-04 ENCOUNTER — Inpatient Hospital Stay (HOSPITAL_COMMUNITY): Payer: Medicare HMO

## 2019-12-04 DIAGNOSIS — I824Y9 Acute embolism and thrombosis of unspecified deep veins of unspecified proximal lower extremity: Secondary | ICD-10-CM

## 2019-12-04 DIAGNOSIS — I2699 Other pulmonary embolism without acute cor pulmonale: Secondary | ICD-10-CM

## 2019-12-04 HISTORY — PX: IR US GUIDE VASC ACCESS LEFT: IMG2389

## 2019-12-04 HISTORY — PX: IR VENOCAVAGRAM IVC: IMG678

## 2019-12-04 HISTORY — PX: IR VENO/EXT/BI: IMG677

## 2019-12-04 HISTORY — PX: IR VENO/EXT/UNI LEFT: IMG675

## 2019-12-04 HISTORY — PX: IR THROMBECT VENO MECH MOD SED: IMG2300

## 2019-12-04 HISTORY — PX: IR US GUIDE VASC ACCESS RIGHT: IMG2390

## 2019-12-04 HISTORY — PX: IR PTA VENOUS EXCEPT DIALYSIS CIRCUIT: IMG6126

## 2019-12-04 LAB — CBC
HCT: 34.3 % — ABNORMAL LOW (ref 36.0–46.0)
Hemoglobin: 11.2 g/dL — ABNORMAL LOW (ref 12.0–15.0)
MCH: 31.5 pg (ref 26.0–34.0)
MCHC: 32.7 g/dL (ref 30.0–36.0)
MCV: 96.3 fL (ref 80.0–100.0)
Platelets: 353 10*3/uL (ref 150–400)
RBC: 3.56 MIL/uL — ABNORMAL LOW (ref 3.87–5.11)
RDW: 12.2 % (ref 11.5–15.5)
WBC: 8.4 10*3/uL (ref 4.0–10.5)
nRBC: 0 % (ref 0.0–0.2)

## 2019-12-04 LAB — COMPREHENSIVE METABOLIC PANEL
ALT: 16 U/L (ref 0–44)
AST: 17 U/L (ref 15–41)
Albumin: 2.7 g/dL — ABNORMAL LOW (ref 3.5–5.0)
Alkaline Phosphatase: 88 U/L (ref 38–126)
Anion gap: 7 (ref 5–15)
BUN: 20 mg/dL (ref 8–23)
CO2: 25 mmol/L (ref 22–32)
Calcium: 9.1 mg/dL (ref 8.9–10.3)
Chloride: 105 mmol/L (ref 98–111)
Creatinine, Ser: 1.07 mg/dL — ABNORMAL HIGH (ref 0.44–1.00)
GFR calc Af Amer: >60 mL/min (ref >60)
GFR calc non Af Amer: 52 mL/min — ABNORMAL LOW (ref >60)
Glucose, Bld: 113 mg/dL — ABNORMAL HIGH (ref 70–99)
Potassium: 4.2 mmol/L (ref 3.5–5.1)
Sodium: 137 mmol/L (ref 135–145)
Total Bilirubin: 0.9 mg/dL (ref 0.3–1.2)
Total Protein: 6 g/dL — ABNORMAL LOW (ref 6.5–8.1)

## 2019-12-04 LAB — PROTEIN S, TOTAL: Protein S Ag, Total: 193 % — ABNORMAL HIGH (ref 60–150)

## 2019-12-04 LAB — PROTEIN C ACTIVITY: Protein C Activity: 122 % (ref 73–180)

## 2019-12-04 LAB — PROTEIN S ACTIVITY: Protein S Activity: 153 % — ABNORMAL HIGH (ref 63–140)

## 2019-12-04 LAB — APTT
aPTT: 105 seconds — ABNORMAL HIGH (ref 24–36)
aPTT: >200 seconds (ref 24–36)

## 2019-12-04 LAB — HOMOCYSTEINE: Homocysteine: 14.6 umol/L (ref 0.0–19.2)

## 2019-12-04 LAB — MAGNESIUM: Magnesium: 2.1 mg/dL (ref 1.7–2.4)

## 2019-12-04 LAB — HEPARIN LEVEL (UNFRACTIONATED): Heparin Unfractionated: >2.2 IU/mL — ABNORMAL HIGH (ref 0.30–0.70)

## 2019-12-04 MED ORDER — IOHEXOL 300 MG/ML  SOLN
100.0000 mL | Freq: Once | INTRAMUSCULAR | Status: AC | PRN
  Administered 2019-12-04: 40 mL via INTRAVENOUS

## 2019-12-04 MED ORDER — FENTANYL CITRATE (PF) 100 MCG/2ML IJ SOLN
INTRAMUSCULAR | Status: AC
  Filled 2019-12-04: qty 2

## 2019-12-04 MED ORDER — MIDAZOLAM HCL 2 MG/2ML IJ SOLN
INTRAMUSCULAR | Status: AC
  Filled 2019-12-04: qty 2

## 2019-12-04 MED ORDER — IOHEXOL 300 MG/ML  SOLN
150.0000 mL | Freq: Once | INTRAMUSCULAR | Status: AC | PRN
  Administered 2019-12-04: 150 mL via INTRAVENOUS

## 2019-12-04 MED ORDER — HYDROCODONE-ACETAMINOPHEN 5-325 MG PO TABS: 1.0000 | ORAL_TABLET | ORAL | Status: DC | PRN

## 2019-12-04 MED ORDER — LIDOCAINE HCL 1 % IJ SOLN
INTRAMUSCULAR | Status: AC
  Filled 2019-12-04: qty 20

## 2019-12-04 MED ORDER — FENTANYL CITRATE (PF) 100 MCG/2ML IJ SOLN
INTRAMUSCULAR | Status: AC | PRN
  Administered 2019-12-04: 50 ug via INTRAVENOUS
  Administered 2019-12-04 (×5): 25 ug via INTRAVENOUS

## 2019-12-04 MED ORDER — MIDAZOLAM HCL 2 MG/2ML IJ SOLN
INTRAMUSCULAR | Status: AC | PRN
  Administered 2019-12-04: 0.5 mg via INTRAVENOUS
  Administered 2019-12-04: 1 mg via INTRAVENOUS
  Administered 2019-12-04 (×3): 0.5 mg via INTRAVENOUS

## 2019-12-04 MED ORDER — LIDOCAINE HCL 1 % IJ SOLN
INTRAMUSCULAR | Status: AC | PRN
  Administered 2019-12-04: 10 mL

## 2019-12-04 MED ORDER — HEPARIN (PORCINE) 25000 UT/250ML-% IV SOLN: 750.0000 [IU]/h | INTRAVENOUS | Status: DC

## 2019-12-04 MED ORDER — HEPARIN BOLUS VIA INFUSION
INTRAVENOUS | Status: AC | PRN
  Administered 2019-12-04: 2000 [IU] via INTRAVENOUS
  Administered 2019-12-04: 1000 [IU] via INTRAVENOUS
  Administered 2019-12-04: 5000 [IU] via INTRAVENOUS
  Administered 2019-12-04: 1000 [IU] via INTRAVENOUS

## 2019-12-04 NOTE — Sedation Documentation (Signed)
Bilateral site checks intact. Area is pink, warm. No hematoma noted. Sensation intact

## 2019-12-04 NOTE — Progress Notes (Signed)
[place] for IV heparin Indication: PE/DVT 12/03/19  Allergies  Allergen Reactions  . Codeine     "makes me feel hyper"    Patient Measurements: Height: 5\' 4"  (162.6 cm) Weight: 67 kg (147 lb 9.6 oz) IBW/kg (Calculated) : 54.7 Heparin Dosing Weight: 67kg  Vital Signs: Temp: 98.4 F (36.9 C) (05/06 0420) Temp Source: Oral (05/06 0420) BP: 108/60 (05/06 0420) Pulse Rate: 102 (05/05 2157)  Labs: Recent Labs    12/03/19 1449 12/03/19 1819 12/04/19 0240 12/04/19 0657  HGB 12.8  --  11.2*  --   HCT 39.3  --  34.3*  --   PLT 411*  --  353  --   APTT  --  47*  --  105*  HEPARINUNFRC  --  >2.20*  --   --   CREATININE 1.33*  --  1.07*  --     Estimated Creatinine Clearance: 45.4 mL/min (A) (by C-G formula based on SCr of 1.07 mg/dL (H)).   Assessment: 74 yo W with acute nonocclusive RLL PE and extensive nonocculsive RLE DVT 12/03/19. Patient was given a dose of Xarelto 15 mg at  1400 on 5/5 at PCP office. Pharmacy is consulted for heparin.   Patient received 4000 unit bolus and was started on 800 units/hr. APTT 12 hours later is slightly supratherapeutic at 105. Confirmed with patient that heparin level was drawn from opposite arm (Left) as heparin infusion (Right arm). No signs of bleeding. Hgb dropped and per patient, has never had so much blood drawn from her in 24 hours.   Goal of Therapy: Heparin level 0.3-0.7 units/ml  APTT 66 to 102 sec  Monitor platelets by anticoagulation protocol: Yes  Plan: Decrease heparin to 750 units/hr F/u aPTT until correlates with heparin level  F/u 6 hour confirmatory level Monitor daily aPTT, HL, CBC/plt Monitor for signs/symptoms of bleeding    Benetta Spar, PharmD, BCPS, BCCP Clinical Pharmacist  Please check AMION for all [place] phone numbers After 10:00 PM, call [place]

## 2019-12-04 NOTE — Progress Notes (Signed)
Patient transported to IR for procedure scheduled for 1430

## 2019-12-04 NOTE — Progress Notes (Signed)
Patient ID: Belinda Perry, female   DOB: Apr 15, 1946, 74 y.o.   MRN: [id number]  PROGRESS NOTE    Belinda Perry  [id number] DOB: Jan 06, 1946 DOA: 12/03/2019 PCP: Lavone Orn, MD   Brief Narrative:  74 year old female with history of hypertension, hyperlipidemia, skin cancer, allergies, prior tobacco abuse quit in 2012, vitamin D deficiency, family history of DVT presented from PCPs office due to right lower extremity DVT diagnosed at PCPs office to rule out PE.  CTA of the chest showed nonocclusive segmental pulmonary embolism involving the right lower lobe, no CT evidence of heart strain.  CT of the abdomen and pelvis showed extensive nonocclusive thrombus involving the right common femoral with extension in the right external and common iliac veins, extensive nonocclusive thrombus involving the IVC to the level of the left renal vein. Indeterminate mass centered within the uterine fundus measuring approximately 3.6 cm.  Follow-up with nonemergent pelvic ultrasound recommended.  Large left-sided heterogeneous thyroid nodule measuring at least 3.2 cm.  And nonemergent outpatient thyroid ultrasound recommended.  Patient was started on IV heparin.  IR was consulted and recommended transfer to [facility]:  Extensive right lower extremity DVT (right common femoral vein with extension into right external and common iliac veins.  Extensive nonocclusive thrombus involving the IVC to the level of the left renal vein) Acute nonocclusive segmental pulmonary embolus involving right lower lobe -Questionable etiology.  Looks like unprovoked DVT and PE -No history of recent car travel, recent surgeries, recent immobility or prior history of DVT.  Has history of DVT and grandmother -CT of the abdomen and pelvis was concerning for uterine mass but pelvic ultrasound revealed that it was a fibroid. -Currently on heparin drip.  IR has been consulted for possible thrombectomy.  Keep  NPO. -Probable discharge tomorrow on oral Xarelto which was started by PCP yesterday.  Uterine fibroid -CT of the abdomen and pelvis showed probable uterine mass.  Pelvic ultrasound showed possible uterine fibroid.  Outpatient follow-up with GYN  Thyroid nodule -Recommend outpatient thyroid ultrasound  Hypertension -Blood pressure on the lower side.  Monitor.  Hyperlipidemia -Continue statin  Allergic rhinitis -continue Claritin, Flonase    DVT prophylaxis: Heparin drip Code Status: Full Family Communication: Husband at bedside Disposition Plan: Status is: Inpatient  Remains inpatient appropriate because: Currently on IV heparin drip for extensive DVT with planned IR intervention today.  Possible discharge home tomorrow on oral Xarelto if remains stable.   Dispo: The patient is from: Home              Anticipated d/c is to: Home              Anticipated d/c date is: 1 day              Patient currently is not medically stable to d/c.  Consultants: IR  Procedures: None  Antimicrobials: None   Subjective: Patient seen and examined at bedside.  Currently denies any chest pain or shortness of breath or leg pain.  Objective: Vitals:   12/04/19 0420 12/04/19 0427 12/04/19 0700 12/04/19 0747  BP: 108/60   117/67  Pulse:    93  Resp: 20 20 (!) 24   Temp: 98.4 F (36.9 C)   98.2 F (36.8 C)  TempSrc: Oral   Oral  SpO2: 93%   94%  Weight:  67 kg    Height:        Intake/Output Summary (Last 24 hours) at 12/04/2019 1113 Last data  filed at 12/04/2019 0648 Gross per 24 hour  Intake 872 ml  Output 400 ml  Net 472 ml   Filed Weights   12/03/19 1801 12/03/19 2157 12/04/19 0427  Weight: 67.1 kg 66.6 kg 67 kg    Examination:  General exam: Appears calm and comfortable  Respiratory system: Bilateral decreased breath sounds at bases with some scattered crackles Cardiovascular system: S1 & S2 heard, Rate controlled Gastrointestinal system: Abdomen is nondistended,  soft and nontender. Normal bowel sounds heard. Extremities: No cyanosis, clubbing; bilateral lower extremity mildly swollen calves. Central nervous system: Alert and oriented. No focal neurological deficits. Moving extremities Skin: No rashes, lesions or ulcers Psychiatry: Judgement and insight appear normal. Mood & affect appropriate.     Data Reviewed: I have personally reviewed following labs and imaging studies  CBC: Recent Labs  Lab 12/03/19 1449 12/04/19 0240  WBC 9.7 8.4  NEUTROABS 6.9  --   HGB 12.8 11.2*  HCT 39.3 34.3*  MCV 97.0 96.3  PLT 411* 0000000   Basic Metabolic Panel: Recent Labs  Lab 12/03/19 1449 12/04/19 0240  NA 137 137  K 3.6 4.2  CL 98 105  CO2 26 25  GLUCOSE 112* 113*  BUN 27* 20  CREATININE 1.33* 1.07*  CALCIUM 9.9 9.1  MG  --  2.1   GFR: Estimated Creatinine Clearance: 45.4 mL/min (A) (by C-G formula based on SCr of 1.07 mg/dL (H)). Liver Function Tests: Recent Labs  Lab 12/04/19 0240  AST 17  ALT 16  ALKPHOS 88  BILITOT 0.9  PROT 6.0*  ALBUMIN 2.7*   No results for input(s): LIPASE, AMYLASE in the last 168 hours. No results for input(s): AMMONIA in the last 168 hours. Coagulation Profile: No results for input(s): INR, PROTIME in the last 168 hours. Cardiac Enzymes: No results for input(s): CKTOTAL, CKMB, CKMBINDEX, TROPONINI in the last 168 hours. BNP (last 3 results) No results for input(s): PROBNP in the last 8760 hours. HbA1C: No results for input(s): HGBA1C in the last 72 hours. CBG: No results for input(s): GLUCAP in the last 168 hours. Lipid Profile: No results for input(s): CHOL, HDL, LDLCALC, TRIG, CHOLHDL, LDLDIRECT in the last 72 hours. Thyroid Function Tests: No results for input(s): TSH, T4TOTAL, FREET4, T3FREE, THYROIDAB in the last 72 hours. Anemia Panel: No results for input(s): VITAMINB12, FOLATE, FERRITIN, TIBC, IRON, RETICCTPCT in the last 72 hours. Sepsis Labs: No results for input(s): PROCALCITON,  LATICACIDVEN in the last 168 hours.  No results found for this or any previous visit (from the past 240 hour(s)).       Radiology Studies: CT Angio Chest PE W and/or Wo Contrast  Result Date: 12/03/2019 CLINICAL DATA:  Shortness of breath and decreased appetite. Known lower extremity DVT. EXAM: CT ANGIOGRAPHY CHEST CT ABDOMEN AND PELVIS WITH CONTRAST TECHNIQUE: Multidetector CT imaging of the chest was performed using the standard protocol during bolus administration of intravenous contrast. Multiplanar CT image reconstructions and MIPs were obtained to evaluate the vascular anatomy. Multidetector CT imaging of the abdomen and pelvis was performed using the standard protocol during bolus administration of intravenous contrast. CONTRAST:  67mL OMNIPAQUE IOHEXOL 350 MG/ML SOLN COMPARISON:  None. FINDINGS: CTA CHEST FINDINGS Cardiovascular: Contrast injection is sufficient to demonstrate satisfactory opacification of the pulmonary arteries to the segmental level.There is an acute appearing segmental pulmonary embolus involving the right lower lobe (axial series 1, image 86). The main pulmonary artery is within normal limits for size. There is no CT evidence of acute right  heart strain. There are atherosclerotic changes of the thoracic aorta without evidence for an aneurysm or dissection. Heart size is normal, without pericardial effusion. Mediastinum/Nodes: --No mediastinal or hilar lymphadenopathy. --No axillary lymphadenopathy. --No supraclavicular lymphadenopathy. --the left thyroid gland is enlarged with a dominant left-sided thyroid nodule that appears to be a measuring at least 3.2 cm. --The esophagus is unremarkable Lungs/Pleura: There are mild emphysematous changes bilaterally. There is no pneumothorax. No significant pleural effusion. The trachea is unremarkable. Musculoskeletal: No chest wall abnormality. No acute or significant osseous findings. Appears to be at least 1 left-sided breast nodule  measuring approximately 1.2 cm (axial series 4, image 138). This was likely previously evaluated on the patient's recent screening mammogram and as such no further follow-up is required. Review of the MIP images confirms the above findings. CT ABDOMEN and PELVIS FINDINGS Hepatobiliary: The liver is normal. Normal gallbladder.There is no biliary ductal dilation. Pancreas: Normal contours without ductal dilatation. No peripancreatic fluid collection. Spleen: Unremarkable. Adrenals/Urinary Tract: --Adrenal glands: Unremarkable. --Right kidney/ureter: No hydronephrosis or radiopaque kidney stones. --Left kidney/ureter: Multiple cortical cysts are noted. There is no hydronephrosis. --Urinary bladder: Unremarkable. Stomach/Bowel: --Stomach/Duodenum: No hiatal hernia or other gastric abnormality. Normal duodenal course and caliber. --Small bowel: Unremarkable. --Colon: Rectosigmoid diverticulosis without acute inflammation. --Appendix: Normal. Vascular/Lymphatic: Atherosclerotic calcification is present within the non-aneurysmal abdominal aorta, without hemodynamically significant stenosis. There is extensive nonocclusive thrombus from the right common femoral vein extending into the right external and common iliac veins into the IVC. The thrombus terminates at the level of the left renal vein. --No retroperitoneal lymphadenopathy. --No mesenteric lymphadenopathy. --No pelvic or inguinal lymphadenopathy. Reproductive: There is an indeterminate mass that appears to be centered within the uterine fundus measuring approximately 3.4 x 3.6 cm. Other: No ascites or free air. The abdominal wall is normal. Musculoskeletal. No acute displaced fractures. 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 There is a chronic appearing compression fracture of the T12 vertebral body. Review of the MIP images confirms the above findings. IMPRESSION: 1. There is an acute nonocclusive segmental pulmonary embolus involving the right lower  lobe. There is no CT evidence for heart strain. 2. Extensive nonocclusive thrombus is noted involving the right common femoral vein with extension into the right external and common iliac veins. There is extensive nonocclusive thrombus involving the IVC to the level of the left renal vein. 3. Indeterminate mass centered within the uterine fundus measuring approximately 3.6 cm. Follow-up with a nonemergent pelvic ultrasound is recommended for further evaluation of this finding. 4. Large left-sided heterogeneous thyroid nodule measuring at least 3.2 cm. A nonemergent outpatient thyroid ultrasound is recommended for further evaluation of this finding.(Ref: J Am Coll Radiol. 2015 Feb;12(2): 143-50). Aortic Atherosclerosis (ICD10-I70.0) and Emphysema (ICD10-J43.9). These results were called by telephone at the time of interpretation on 12/03/2019 at 5:06 pm to provider [facility] , who verbally acknowledged these results. Electronically Signed   By: Constance Holster M.D.   On: 12/03/2019 17:19   CT ABDOMEN PELVIS W CONTRAST  Result Date: 12/03/2019 CLINICAL DATA:  Shortness of breath and decreased appetite. Known lower extremity DVT. EXAM: CT ANGIOGRAPHY CHEST CT ABDOMEN AND PELVIS WITH CONTRAST TECHNIQUE: Multidetector CT imaging of the chest was performed using the standard protocol during bolus administration of intravenous contrast. Multiplanar CT image reconstructions and MIPs were obtained to evaluate the vascular anatomy. Multidetector CT imaging of the abdomen and pelvis was performed using the standard protocol during bolus administration of intravenous contrast. CONTRAST:  58mL OMNIPAQUE IOHEXOL 350 MG/ML SOLN COMPARISON:  None. FINDINGS: CTA CHEST FINDINGS Cardiovascular: Contrast injection is sufficient to demonstrate satisfactory opacification of the pulmonary arteries to the segmental level.There is an acute appearing segmental pulmonary embolus involving the right lower lobe (axial series 1, image  86). The main pulmonary artery is within normal limits for size. There is no CT evidence of acute right heart strain. There are atherosclerotic changes of the thoracic aorta without evidence for an aneurysm or dissection. Heart size is normal, without pericardial effusion. Mediastinum/Nodes: --No mediastinal or hilar lymphadenopathy. --No axillary lymphadenopathy. --No supraclavicular lymphadenopathy. --the left thyroid gland is enlarged with a dominant left-sided thyroid nodule that appears to be a measuring at least 3.2 cm. --The esophagus is unremarkable Lungs/Pleura: There are mild emphysematous changes bilaterally. There is no pneumothorax. No significant pleural effusion. The trachea is unremarkable. Musculoskeletal: No chest wall abnormality. No acute or significant osseous findings. Appears to be at least 1 left-sided breast nodule measuring approximately 1.2 cm (axial series 4, image 138). This was likely previously evaluated on the patient's recent screening mammogram and as such no further follow-up is required. Review of the MIP images confirms the above findings. CT ABDOMEN and PELVIS FINDINGS Hepatobiliary: The liver is normal. Normal gallbladder.There is no biliary ductal dilation. Pancreas: Normal contours without ductal dilatation. No peripancreatic fluid collection. Spleen: Unremarkable. Adrenals/Urinary Tract: --Adrenal glands: Unremarkable. --Right kidney/ureter: No hydronephrosis or radiopaque kidney stones. --Left kidney/ureter: Multiple cortical cysts are noted. There is no hydronephrosis. --Urinary bladder: Unremarkable. Stomach/Bowel: --Stomach/Duodenum: No hiatal hernia or other gastric abnormality. Normal duodenal course and caliber. --Small bowel: Unremarkable. --Colon: Rectosigmoid diverticulosis without acute inflammation. --Appendix: Normal. Vascular/Lymphatic: Atherosclerotic calcification is present within the non-aneurysmal abdominal aorta, without hemodynamically significant  stenosis. There is extensive nonocclusive thrombus from the right common femoral vein extending into the right external and common iliac veins into the IVC. The thrombus terminates at the level of the left renal vein. --No retroperitoneal lymphadenopathy. --No mesenteric lymphadenopathy. --No pelvic or inguinal lymphadenopathy. Reproductive: There is an indeterminate mass that appears to be centered within the uterine fundus measuring approximately 3.4 x 3.6 cm. Other: No ascites or free air. The abdominal wall is normal. Musculoskeletal. No acute displaced fractures. 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 There is a chronic appearing compression fracture of the T12 vertebral body. Review of the MIP images confirms the above findings. IMPRESSION: 1. There is an acute nonocclusive segmental pulmonary embolus involving the right lower lobe. There is no CT evidence for heart strain. 2. Extensive nonocclusive thrombus is noted involving the right common femoral vein with extension into the right external and common iliac veins. There is extensive nonocclusive thrombus involving the IVC to the level of the left renal vein. 3. Indeterminate mass centered within the uterine fundus measuring approximately 3.6 cm. Follow-up with a nonemergent pelvic ultrasound is recommended for further evaluation of this finding. 4. Large left-sided heterogeneous thyroid nodule measuring at least 3.2 cm. A nonemergent outpatient thyroid ultrasound is recommended for further evaluation of this finding.(Ref: J Am Coll Radiol. 2015 Feb;12(2): 143-50). Aortic Atherosclerosis (ICD10-I70.0) and Emphysema (ICD10-J43.9). These results were called by telephone at the time of interpretation on 12/03/2019 at 5:06 pm to provider [facility] , who verbally acknowledged these results. Electronically Signed   By: Constance Holster M.D.   On: 12/03/2019 17:19   US PELVIC COMPLETE WITH TRANSVAGINAL  Result Date: 12/04/2019 CLINICAL DATA:   Initial evaluation for uterine mass seen on  prior CT. EXAM: TRANSABDOMINAL AND TRANSVAGINAL ULTRASOUND OF PELVIS TECHNIQUE: Both transabdominal and transvaginal ultrasound examinations of the pelvis were performed. Transabdominal technique was performed for global imaging of the pelvis including uterus, ovaries, adnexal regions, and pelvic cul-de-sac. It was necessary to proceed with endovaginal exam following the transabdominal exam to visualize the uterus, endometrium, and ovaries. COMPARISON:  Prior CT from 12/03/2019. FINDINGS: Uterus Measurements: 4.9 x 4.4 x 4.1 cm = volume: 44.9 mL. Exophytic fibroid extending from the right anterior uterine fundus measures 3.2 x 2.7 x 3.6 cm, corresponding with mass lesion seen on prior CT. Endometrium Thickness: 4 mm.  No focal abnormality visualized. Right ovary Not visualized.  No adnexal mass. Left ovary Not visualized.  No adnexal mass. Other findings No abnormal free fluid. IMPRESSION: 1. 3.6 cm exophytic fibroid extending from the right uterine fundus, corresponding with abnormality on prior CT. 2. No other acute abnormality identified. 3. Nonvisualization of either ovary. No other pelvic or adnexal mass. Electronically Signed   By: Jeannine Boga M.D.   On: 12/04/2019 00:47   VAS [place] LOWER EXTREMITY VENOUS (DVT)  Result Date: 12/03/2019  Lower Venous DVTStudy Indications: Pain, and Swelling.  Risk Factors: None identified. Comparison Study: No prior studies. Performing Technologist: Oliver Hum RVT  Examination Guidelines: A complete evaluation includes B-mode imaging, spectral Doppler, color Doppler, and power Doppler as needed of all accessible portions of each vessel. Bilateral testing is considered an integral part of a complete examination. Limited examinations for reoccurring indications may be performed as noted. The reflux portion of the exam is performed with the patient in reverse Trendelenburg.   +---------+---------------+---------+-----------+----------+--------------+  RIGHT     Compressibility Phasicity Spontaneity Properties Thrombus Aging  +---------+---------------+---------+-----------+----------+--------------+  CFV       None            No        No                     Acute           +---------+---------------+---------+-----------+----------+--------------+  SFJ       None                                             Acute           +---------+---------------+---------+-----------+----------+--------------+  FV Prox   None            No        No                     Acute           +---------+---------------+---------+-----------+----------+--------------+  FV Mid    None            No        No                     Acute           +---------+---------------+---------+-----------+----------+--------------+  FV Distal None            No        No                     Acute           +---------+---------------+---------+-----------+----------+--------------+  PFV  None                                             Acute           +---------+---------------+---------+-----------+----------+--------------+  POP       None            No        No                     Acute           +---------+---------------+---------+-----------+----------+--------------+  PTV       None                                             Acute           +---------+---------------+---------+-----------+----------+--------------+  PERO      None                                             Acute           +---------+---------------+---------+-----------+----------+--------------+  Gastroc   None                                             Acute           +---------+---------------+---------+-----------+----------+--------------+  EIV       None            No        No                     Acute           +---------+---------------+---------+-----------+----------+--------------+  CIV       Full                                                              +---------+---------------+---------+-----------+----------+--------------+   +----+---------------+---------+-----------+----------+--------------+  LEFT Compressibility Phasicity Spontaneity Properties Thrombus Aging  +----+---------------+---------+-----------+----------+--------------+  CFV  Full            Yes       Yes                                    +----+---------------+---------+-----------+----------+--------------+     Summary: RIGHT: - Findings consistent with acute deep vein thrombosis involving the right external iliac vein, common femoral vein, SF junction, right femoral vein, right proximal profunda vein, right popliteal vein, right posterior tibial veins, right peroneal veins, and right gastrocnemius veins. - No cystic structure found in the popliteal fossa.  LEFT: - No evidence of common femoral vein obstruction.  *See table(s) above for measurements and observations. Electronically signed by Harold Barban MD on 12/03/2019 at 8:41:53 PM.    Final  Scheduled Meds:  loratadine  10 mg Oral Daily   multivitamin with minerals  1 tablet Oral Daily   simvastatin  40 mg Oral QHS   sodium chloride flush  3 mL Intravenous Q12H   Continuous Infusions:  sodium chloride 75 mL/hr at 12/04/19 0648   heparin 750 Units/hr (12/04/19 0810)          Aline August, MD [facility] 12/04/2019, 11:13 AM

## 2019-12-04 NOTE — Consult Note (Signed)
Chief Complaint: Patient was seen in consultation today for right lower extremity DVT  Referring Physician(s): Starla Link, [place]shit[place]  Supervising Physician: Jacqulynn Cadet  Patient Status: [facility] - In-pt  History of Present Illness: Belinda Perry is a 74 y.o. female with a past medical history significant for OA, skin cancer (1994), HLD and HTN who presented [facility] ED yesterday with complaints of right lower extremity swelling. She had been diagnosed with DVT earlier that day at her PCP's office and was then sent to the ED due to concerns for PE based on reports of dyspnea, elevated D-dimer and hypotension. Workup did not show PE however CT A/P did note extensive nonocclusive thrombus involving the right common femoral vein with extension into the right external and common iliac veins as well as extensive nonocclusive thrombus involving the IVC to the level of left renal vein. She was also noted to have a concerning pelvic mass and a large left sided thyroid nodule - pelvic US showed a uterine fibroid and she is planned for outpatient follow up of the thyroid nodule. IR has been consulted for possible DVT thrombectomy and possible DVT lysis.  [name] seen in her room, husband at bedside. She reports having RLE swelling for about 3 weeks however several days before she presented to her PCP she noted that he foot was so swollen she could not get her shoes or socks on and had great difficulty putting pants over her swollen leg. She also was unable to walk due to tightness in her leg which made it feel like her leg muscles couldn't move. She also reports feeling very good in the morning however by lunch time she is significantly fatigued and out of breath. She has no history of cancer, long distance travel, significant time being bedridden or recent falls/trauma - she does report a history of DVT in her grandmother. She also reports that she has been on several blood thinners before but did not tolerate them  and they were subsequently stopped, she denies any history of GI bleeding or other abnormal bleeding. She is eager to go home but understands the severity of her DVT and wants to do what is best for her including proceeding with a procedure in IR if needed. Etiology and medical management of DVT/PE was discussed with patient and her husband today, additionally discussed was the concern for chronic RLE swelling given the extent of her DVT and that undergoing thrombectomy/lysis today may help to prevent that in the future - after thorough discussion if the risks, benefits and alternatives she has agreed to proceed with procedure in IR.    Past Medical History:  Diagnosis Date  . Allergic reaction to inhaled pollen   . Allergy   . Hyperlipidemia   . Hypertension   . Osteoarthritis, generalized   . Other osteoporosis   . Skin cancer, basal cell 1994   face    Past Surgical History:  Procedure Laterality Date  . AUGMENTATION MAMMAPLASTY Bilateral   . BREAST ENHANCEMENT SURGERY Bilateral 1973  . COLONOSCOPY    . FOOT SURGERY Bilateral    right 8/12, 11/12 for left. Dr Milinda Pointer  . POLYPECTOMY    . TONSILLECTOMY N/A 1970  . TUBAL LIGATION      Allergies: Codeine  Medications: Prior to Admission medications   Medication Sig Start Date End Date Taking? Authorizing Provider  BIOTIN PO Take 1 tablet by mouth.   Yes [provider]  cetirizine (ZYRTEC) 10 MG tablet Take 10 mg by  mouth daily as needed for allergies.   Yes [provider]  fluticasone (FLONASE) 50 MCG/ACT nasal spray Place 2 sprays into both nostrils daily as needed for allergies or rhinitis.   Yes [provider]  Multiple Vitamins-Minerals (MULTIVITAL) CHEW Chew 1 tablet by mouth daily.   Yes [provider]  Probiotic Product (PROBIOTIC DAILY PO) Take 1 tablet by mouth.   Yes [provider]  simvastatin (ZOCOR) 40 MG tablet TAKE 1 TABLET (40 MG TOTAL) BY MOUTH AT BEDTIME. Patient  taking differently: Take 40 mg by mouth at bedtime.  11/30/16  Yes Venia Carbon, MD  potassium chloride (KLOR-CON) 10 MEQ tablet Take 10 mEq by mouth daily. 12/02/19   [provider]     Family History  Problem Relation Age of Onset  . Heart disease Mother   . Heart disease Father   . Diabetes Father   . Hypertension Father   . Stroke Sister   . Heart disease Brother   . Cancer Maternal Grandfather        colon  . Colon cancer Maternal Grandfather 67  . Cancer Other        various cancers  . Colon cancer Paternal Uncle 10  . Colon polyps Neg Hx   . Esophageal cancer Neg Hx   . Rectal cancer Neg Hx   . Stomach cancer Neg Hx     Social History   Socioeconomic History  . Marital status: Married    Spouse name: Not on file  . Number of children: 1  . Years of education: Not on file  . Highest education level: Not on file  Occupational History  . 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 Smoking status: Former Smoker    Types: Cigarettes  . Smokeless tobacco: Never Used  Substance and Sexual Activity  . Alcohol use: No  . Drug use: No  . Sexual activity: Not on file  Other Topics Concern  . Not on file  Social History Narrative   1 daughter      No living will   Husband should be health care POA   Would accept resuscitation but no prolonged machines   No feeding tubes if cognitively unaware   Social Determinants of Health   Financial Resource Strain:   . Difficulty of Paying Living Expenses:   Food Insecurity:   . Worried About Charity fundraiser in the Last Year:   . Arboriculturist in the Last Year:   Transportation Needs:   . Film/video editor (Medical):   Marland Kitchen Lack of Transportation (Non-Medical):   Physical Activity:   . Days of Exercise per Week:   . Minutes of Exercise per Session:   Stress:   . Feeling of Stress :   Social Connections:   . Frequency of Communication with Friends and Family:   .  Frequency of Social Gatherings with Friends and Family:   . Attends Religious Services:   . Active Member of Clubs or Organizations:   . Attends Archivist Meetings:   Marland Kitchen Marital Status:      Review of Systems: A 12 point ROS discussed and pertinent positives are indicated in the HPI above.  All other systems are negative.  Review of Systems  Constitutional: Negative for appetite change, chills and fever.  Respiratory: Negative for cough and shortness of breath.   Cardiovascular: Positive for leg swelling (RLE - improving per patient). Negative  for chest pain.  Gastrointestinal: Negative for abdominal pain, blood in stool, diarrhea, nausea and vomiting.  Genitourinary: Negative for hematuria.  Musculoskeletal: Positive for back pain (chronic). Negative for gait problem.  Skin: Negative for wound.  Neurological: Negative for dizziness and headaches.    Vital Signs: BP 117/67 (BP Location: Left Arm)   Pulse 93   Temp 98.2 F (36.8 C) (Oral)   Resp (!) 24   Ht 5\' 4"  (1.626 m)   Wt 147 lb 9.6 oz (67 kg)   SpO2 94%   BMI 25.34 kg/m   Physical Exam Vitals and nursing note reviewed.  Constitutional:      General: She is not in acute distress.    Appearance: She is not ill-appearing.     Comments: Patient sitting up in bed, easily able to speak in full sentences, no supplemental oxygen.  HENT:     Head: Normocephalic.     Mouth/Throat:     Mouth: Mucous membranes are moist.     Pharynx: Oropharynx is clear. No oropharyngeal exudate or posterior oropharyngeal erythema.  Cardiovascular:     Rate and Rhythm: Normal rate and regular rhythm.  Pulmonary:     Effort: Pulmonary effort is normal.     Breath sounds: Normal breath sounds.  Abdominal:     General: There is no distension.     Palpations: Abdomen is soft.     Tenderness: There is no abdominal tenderness.  Musculoskeletal:     Right lower leg: Edema (2+ non pitting, non tender, palpable DP) present.     Left  lower leg: No edema.  Skin:    General: Skin is warm and dry.  Neurological:     Mental Status: She is alert and oriented to person, place, and time.  Psychiatric:        Mood and Affect: Mood normal.        Behavior: Behavior normal.        Thought Content: Thought content normal.        Judgment: Judgment normal.      MD Evaluation Airway: WNL Heart: WNL Abdomen: WNL Chest/ Lungs: WNL ASA  Classification: 3 Mallampati/Airway Score: Two   Imaging: CT Angio Chest PE W and/or Wo Contrast  Result Date: 12/03/2019 CLINICAL DATA:  Shortness of breath and decreased appetite. Known lower extremity DVT. EXAM: CT ANGIOGRAPHY CHEST CT ABDOMEN AND PELVIS WITH CONTRAST TECHNIQUE: Multidetector CT imaging of the chest was performed using the standard protocol during bolus administration of intravenous contrast. Multiplanar CT image reconstructions and MIPs were obtained to evaluate the vascular anatomy. Multidetector CT imaging of the abdomen and pelvis was performed using the standard protocol during bolus administration of intravenous contrast. CONTRAST:  63mL OMNIPAQUE IOHEXOL 350 MG/ML SOLN COMPARISON:  None. FINDINGS: CTA CHEST FINDINGS Cardiovascular: Contrast injection is sufficient to demonstrate satisfactory opacification of the pulmonary arteries to the segmental level.There is an acute appearing segmental pulmonary embolus involving the right lower lobe (axial series 1, image 86). The main pulmonary artery is within normal limits for size. There is no CT evidence of acute right heart strain. There are atherosclerotic changes of the thoracic aorta without evidence for an aneurysm or dissection. Heart size is normal, without pericardial effusion. Mediastinum/Nodes: --No mediastinal or hilar lymphadenopathy. --No axillary lymphadenopathy. --No supraclavicular lymphadenopathy. --the left thyroid gland is enlarged with a dominant left-sided thyroid nodule that appears to be a measuring at least  3.2 cm. --The esophagus is unremarkable Lungs/Pleura: There are mild emphysematous changes  bilaterally. There is no pneumothorax. No significant pleural effusion. The trachea is unremarkable. Musculoskeletal: No chest wall abnormality. No acute or significant osseous findings. Appears to be at least 1 left-sided breast nodule measuring approximately 1.2 cm (axial series 4, image 138). This was likely previously evaluated on the patient's recent screening mammogram and as such no further follow-up is required. Review of the MIP images confirms the above findings. CT ABDOMEN and PELVIS FINDINGS Hepatobiliary: The liver is normal. Normal gallbladder.There is no biliary ductal dilation. Pancreas: Normal contours without ductal dilatation. No peripancreatic fluid collection. Spleen: Unremarkable. Adrenals/Urinary Tract: --Adrenal glands: Unremarkable. --Right kidney/ureter: No hydronephrosis or radiopaque kidney stones. --Left kidney/ureter: Multiple cortical cysts are noted. There is no hydronephrosis. --Urinary bladder: Unremarkable. Stomach/Bowel: --Stomach/Duodenum: No hiatal hernia or other gastric abnormality. Normal duodenal course and caliber. --Small bowel: Unremarkable. --Colon: Rectosigmoid diverticulosis without acute inflammation. --Appendix: Normal. Vascular/Lymphatic: Atherosclerotic calcification is present within the non-aneurysmal abdominal aorta, without hemodynamically significant stenosis. There is extensive nonocclusive thrombus from the right common femoral vein extending into the right external and common iliac veins into the IVC. The thrombus terminates at the level of the left renal vein. --No retroperitoneal lymphadenopathy. --No mesenteric lymphadenopathy. --No pelvic or inguinal lymphadenopathy. Reproductive: There is an indeterminate mass that appears to be centered within the uterine fundus measuring approximately 3.4 x 3.6 cm. Other: No ascites or free air. The abdominal wall is normal.  Musculoskeletal. No acute displaced fractures. 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 There is a chronic appearing compression fracture of the T12 vertebral body. Review of the MIP images confirms the above findings. IMPRESSION: 1. There is an acute nonocclusive segmental pulmonary embolus involving the right lower lobe. There is no CT evidence for heart strain. 2. Extensive nonocclusive thrombus is noted involving the right common femoral vein with extension into the right external and common iliac veins. There is extensive nonocclusive thrombus involving the IVC to the level of the left renal vein. 3. Indeterminate mass centered within the uterine fundus measuring approximately 3.6 cm. Follow-up with a nonemergent pelvic ultrasound is recommended for further evaluation of this finding. 4. Large left-sided heterogeneous thyroid nodule measuring at least 3.2 cm. A nonemergent outpatient thyroid ultrasound is recommended for further evaluation of this finding.(Ref: J Am Coll Radiol. 2015 Feb;12(2): 143-50). Aortic Atherosclerosis (ICD10-I70.0) and Emphysema (ICD10-J43.9). These results were called by telephone at the time of interpretation on 12/03/2019 at 5:06 pm to provider [facility] , who verbally acknowledged these results. Electronically Signed   By: Constance Holster M.D.   On: 12/03/2019 17:19   CT ABDOMEN PELVIS W CONTRAST  Result Date: 12/03/2019 CLINICAL DATA:  Shortness of breath and decreased appetite. Known lower extremity DVT. EXAM: CT ANGIOGRAPHY CHEST CT ABDOMEN AND PELVIS WITH CONTRAST TECHNIQUE: Multidetector CT imaging of the chest was performed using the standard protocol during bolus administration of intravenous contrast. Multiplanar CT image reconstructions and MIPs were obtained to evaluate the vascular anatomy. Multidetector CT imaging of the abdomen and pelvis was performed using the standard protocol during bolus administration of intravenous contrast. CONTRAST:  18mL  OMNIPAQUE IOHEXOL 350 MG/ML SOLN COMPARISON:  None. FINDINGS: CTA CHEST FINDINGS Cardiovascular: Contrast injection is sufficient to demonstrate satisfactory opacification of the pulmonary arteries to the segmental level.There is an acute appearing segmental pulmonary embolus involving the right lower lobe (axial series 1, image 86). The main pulmonary artery is within normal limits for size. There is no CT evidence of acute right heart strain.  There are atherosclerotic changes of the thoracic aorta without evidence for an aneurysm or dissection. Heart size is normal, without pericardial effusion. Mediastinum/Nodes: --No mediastinal or hilar lymphadenopathy. --No axillary lymphadenopathy. --No supraclavicular lymphadenopathy. --the left thyroid gland is enlarged with a dominant left-sided thyroid nodule that appears to be a measuring at least 3.2 cm. --The esophagus is unremarkable Lungs/Pleura: There are mild emphysematous changes bilaterally. There is no pneumothorax. No significant pleural effusion. The trachea is unremarkable. Musculoskeletal: No chest wall abnormality. No acute or significant osseous findings. Appears to be at least 1 left-sided breast nodule measuring approximately 1.2 cm (axial series 4, image 138). This was likely previously evaluated on the patient's recent screening mammogram and as such no further follow-up is required. Review of the MIP images confirms the above findings. CT ABDOMEN and PELVIS FINDINGS Hepatobiliary: The liver is normal. Normal gallbladder.There is no biliary ductal dilation. Pancreas: Normal contours without ductal dilatation. No peripancreatic fluid collection. Spleen: Unremarkable. Adrenals/Urinary Tract: --Adrenal glands: Unremarkable. --Right kidney/ureter: No hydronephrosis or radiopaque kidney stones. --Left kidney/ureter: Multiple cortical cysts are noted. There is no hydronephrosis. --Urinary bladder: Unremarkable. Stomach/Bowel: --Stomach/Duodenum: No hiatal  hernia or other gastric abnormality. Normal duodenal course and caliber. --Small bowel: Unremarkable. --Colon: Rectosigmoid diverticulosis without acute inflammation. --Appendix: Normal. Vascular/Lymphatic: Atherosclerotic calcification is present within the non-aneurysmal abdominal aorta, without hemodynamically significant stenosis. There is extensive nonocclusive thrombus from the right common femoral vein extending into the right external and common iliac veins into the IVC. The thrombus terminates at the level of the left renal vein. --No retroperitoneal lymphadenopathy. --No mesenteric lymphadenopathy. --No pelvic or inguinal lymphadenopathy. Reproductive: There is an indeterminate mass that appears to be centered within the uterine fundus measuring approximately 3.4 x 3.6 cm. Other: No ascites or free air. The abdominal wall is normal. Musculoskeletal. No acute displaced fractures. 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 There is a chronic appearing compression fracture of the T12 vertebral body. Review of the MIP images confirms the above findings. IMPRESSION: 1. There is an acute nonocclusive segmental pulmonary embolus involving the right lower lobe. There is no CT evidence for heart strain. 2. Extensive nonocclusive thrombus is noted involving the right common femoral vein with extension into the right external and common iliac veins. There is extensive nonocclusive thrombus involving the IVC to the level of the left renal vein. 3. Indeterminate mass centered within the uterine fundus measuring approximately 3.6 cm. Follow-up with a nonemergent pelvic ultrasound is recommended for further evaluation of this finding. 4. Large left-sided heterogeneous thyroid nodule measuring at least 3.2 cm. A nonemergent outpatient thyroid ultrasound is recommended for further evaluation of this finding.(Ref: J Am Coll Radiol. 2015 Feb;12(2): 143-50). Aortic Atherosclerosis (ICD10-I70.0) and Emphysema  (ICD10-J43.9). These results were called by telephone at the time of interpretation on 12/03/2019 at 5:06 pm to provider [facility] , who verbally acknowledged these results. Electronically Signed   By: Constance Holster M.D.   On: 12/03/2019 17:19   MM 3D SCREEN BREAST W/IMPLANT BILATERAL  Result Date: 11/11/2019 CLINICAL DATA:  Screening. EXAM: DIGITAL SCREENING BILATERAL MAMMOGRAM WITH IMPLANTS, CAD AND TOMO The patient has prepectoral implants. Standard and implant displaced views were performed. COMPARISON:  Previous exam(s). ACR Breast Density Category b: There are scattered areas of fibroglandular density. FINDINGS: There are no findings suspicious for malignancy. Images were processed with CAD. IMPRESSION: No mammographic evidence of malignancy. A result letter of this screening mammogram will be mailed directly to the patient. RECOMMENDATION: Screening  mammogram in one year. (Code:SM-B-01Y) BI-RADS CATEGORY  1:  Negative. Electronically Signed   By: Kristopher Oppenheim M.D.   On: 11/11/2019 10:41   US PELVIC COMPLETE WITH TRANSVAGINAL  Result Date: 12/04/2019 CLINICAL DATA:  Initial evaluation for uterine mass seen on prior CT. EXAM: TRANSABDOMINAL AND TRANSVAGINAL ULTRASOUND OF PELVIS TECHNIQUE: Both transabdominal and transvaginal ultrasound examinations of the pelvis were performed. Transabdominal technique was performed for global imaging of the pelvis including uterus, ovaries, adnexal regions, and pelvic cul-de-sac. It was necessary to proceed with endovaginal exam following the transabdominal exam to visualize the uterus, endometrium, and ovaries. COMPARISON:  Prior CT from 12/03/2019. FINDINGS: Uterus Measurements: 4.9 x 4.4 x 4.1 cm = volume: 44.9 mL. Exophytic fibroid extending from the right anterior uterine fundus measures 3.2 x 2.7 x 3.6 cm, corresponding with mass lesion seen on prior CT. Endometrium Thickness: 4 mm.  No focal abnormality visualized. Right ovary Not visualized.  No  adnexal mass. Left ovary Not visualized.  No adnexal mass. Other findings No abnormal free fluid. IMPRESSION: 1. 3.6 cm exophytic fibroid extending from the right uterine fundus, corresponding with abnormality on prior CT. 2. No other acute abnormality identified. 3. Nonvisualization of either ovary. No other pelvic or adnexal mass. Electronically Signed   By: Jeannine Boga M.D.   On: 12/04/2019 00:47   VAS [place] LOWER EXTREMITY VENOUS (DVT)  Result Date: 12/03/2019  Lower Venous DVTStudy Indications: Pain, and Swelling.  Risk Factors: None identified. Comparison Study: No prior studies. Performing Technologist: Oliver Hum RVT  Examination Guidelines: A complete evaluation includes B-mode imaging, spectral Doppler, color Doppler, and power Doppler as needed of all accessible portions of each vessel. Bilateral testing is considered an integral part of a complete examination. Limited examinations for reoccurring indications may be performed as noted. The reflux portion of the exam is performed with the patient in reverse Trendelenburg.  +---------+---------------+---------+-----------+----------+--------------+ RIGHT    CompressibilityPhasicitySpontaneityPropertiesThrombus Aging +---------+---------------+---------+-----------+----------+--------------+ CFV      None           No       No                   Acute          +---------+---------------+---------+-----------+----------+--------------+ SFJ      None                                         Acute          +---------+---------------+---------+-----------+----------+--------------+ FV Prox  None           No       No                   Acute          +---------+---------------+---------+-----------+----------+--------------+ FV Mid   None           No       No                   Acute          +---------+---------------+---------+-----------+----------+--------------+ FV DistalNone           No       No                    Acute          +---------+---------------+---------+-----------+----------+--------------+ PFV  None                                         Acute          +---------+---------------+---------+-----------+----------+--------------+ POP      None           No       No                   Acute          +---------+---------------+---------+-----------+----------+--------------+ PTV      None                                         Acute          +---------+---------------+---------+-----------+----------+--------------+ PERO     None                                         Acute          +---------+---------------+---------+-----------+----------+--------------+ Gastroc  None                                         Acute          +---------+---------------+---------+-----------+----------+--------------+ EIV      None           No       No                   Acute          +---------+---------------+---------+-----------+----------+--------------+ CIV      Full                                                        +---------+---------------+---------+-----------+----------+--------------+   +----+---------------+---------+-----------+----------+--------------+ LEFTCompressibilityPhasicitySpontaneityPropertiesThrombus Aging +----+---------------+---------+-----------+----------+--------------+ CFV Full           Yes      Yes                                 +----+---------------+---------+-----------+----------+--------------+     Summary: RIGHT: - Findings consistent with acute deep vein thrombosis involving the right external iliac vein, common femoral vein, SF junction, right femoral vein, right proximal profunda vein, right popliteal vein, right posterior tibial veins, right peroneal veins, and right gastrocnemius veins. - No cystic structure found in the popliteal fossa.  LEFT: - No evidence of common femoral vein obstruction.  *See table(s)  above for measurements and observations. Electronically signed by Harold Barban MD on 12/03/2019 at 8:41:53 PM.    Final     Labs:  CBC: Recent Labs    12/03/19 1449 12/04/19 0240  WBC 9.7 8.4  HGB 12.8 11.2*  HCT 39.3 34.3*  PLT 411* 353    COAGS: Recent Labs    12/03/19 1819 12/04/19 0657  APTT 47* 105*    BMP: Recent Labs    12/03/19 1449 12/04/19  0240  NA 137 137  K 3.6 4.2  CL 98 105  CO2 26 25  GLUCOSE 112* 113*  BUN 27* 20  CALCIUM 9.9 9.1  CREATININE 1.33* 1.07*  GFRNONAA 40* 52*  GFRAA 46* >60    LIVER FUNCTION TESTS: Recent Labs    12/04/19 0240  BILITOT 0.9  AST 17  ALT 16  ALKPHOS 88  PROT 6.0*  ALBUMIN 2.7*    TUMOR MARKERS: No results for input(s): AFPTM, CEA, CA199, CHROMGRNA in the last 8760 hours.  Assessment and Plan:  74 y/o F seen in consultation today for possible DVT thrombectomy/lysis - she has recently been diagnosed with extensive RLE DVT and has been admitted for further workup. She is currently tolerating heparin gtt and her swelling has improved. Extensive discussion regarding possible procedure with patient, her husband and Dr. Laurence Ferrari at bedside today - after thorough discussion she has decided to proceed with image guided venogram with possible thrombectomy/possible lysis. We are planning to proceed with procedure today pending any emergent procedures in IR.  She has been NPO since midnight, currently on heparin gtt. WBC 8.4, hgb 11.2, plt 353.  Risks and benefits discussed with the patient including, but not limited to bleeding, possible life threatening bleeding and need for blood product transfusion, vascular injury, stroke, contrast induced renal failure, limb loss and infection.  All of the patient's questions were answered, patient is agreeable to proceed.  Consent signed and in chart.  Thank you for this interesting consult.  I greatly enjoyed meeting Rogene Kelly and look forward to participating in their  care.  A copy of this report was sent to the requesting provider on this date.  Electronically Signed: Joaquim Nam, PA-C 12/04/2019, 9:50 AM   I spent a total of 40 Minutes in face to face in clinical consultation, greater than 50% of which was counseling/coordinating care for DVT thrombectomy/lysis.

## 2019-12-04 NOTE — Progress Notes (Addendum)
[place] for IV Heparin Indication:  PE/DVT 12/03/19  Allergies  Allergen Reactions  . Codeine     "makes me feel hyper"    Patient Measurements: Height: 5\' 4"  (162.6 cm) Weight: 67 kg (147 lb 9.6 oz) IBW/kg (Calculated) : 54.7 Heparin Dosing Weight: 67 kg  Vital Signs: Temp: 98.4 F (36.9 C) (05/06 2017) Temp Source: Oral (05/06 2017) BP: 108/72 (05/06 2017) Pulse Rate: 94 (05/06 2017)  Labs: Recent Labs    12/03/19 1449 12/03/19 1819 12/04/19 0240 12/04/19 0657  HGB 12.8  --  11.2*  --   HCT 39.3  --  34.3*  --   PLT 411*  --  353  --   APTT  --  47*  --  105*  HEPARINUNFRC  --  >2.20*  --  >2.20*  CREATININE 1.33*  --  1.07*  --     Estimated Creatinine Clearance: 45.4 mL/min (A) (by C-G formula based on SCr of 1.07 mg/dL (H)).   Assessment: 74 yr old female with acute nonocclusive RLL PE and extensive nonocculsive RLE DVT on 12/03/19. Patient was given a dose of Xarelto 15 mg at 1400 on 5/5 at PCP office. Pharmacy was  consulted for heparin. Given potential effect of Xarelto on heparin levels (falsely elevated), will monitor anticoagulation using aPTT until aPTT and heparin level correlate.  Patient received 4000 unit IV heparin bolus and was started on heparin infusion at  800 units/hr. aPTT 12 hours later was slightly supratherapeutic at 105 sec. Pharmacist confirmed with patient that heparin level was drawn from opposite arm (left) as heparin infusion (right arm). Heparin infusion was decreased to 750 units/hr. No signs of bleeding. Hgb dropped and, per patient, she has never had so much blood drawn from her in 24 hours.   Pt underwent caval and RLE mechanical thrombectomy by IR this afternoon. Per RN, heparin infusion was continued throughout the procedure; IR MD wrote in procedure note to continue heparin and transition to weight-based Lovenox prior to discharge (he recommends 6 wks of Lovenox prior to transition to [place] if  possible). Per RN, no issues with IV or bleeding observed post procedure.  Goal of Therapy: Heparin level 0.3-0.7 units/ml  aPTT 66 -102 sec  Monitor platelets by anticoagulation protocol: Yes  Plan: Continue heparin at 750 units/hr Check aPTT, heparin level 6 hrs after procedure Monitor daily aPTT, heparin level, CBC/plt Monitor for signs/symptoms of bleeding   Gillermina Hu, PharmD, BCPS, [facility] Clinical Pharmacist 12/04/19, 20:26 PM

## 2019-12-04 NOTE — Sedation Documentation (Signed)
Bedside handoff given to [facility] RN. Bilateral site checks performed. No hematoma noted. Soft upon palpation. Extremities pink, warm and dry

## 2019-12-04 NOTE — Care Management (Signed)
Per [facility]:  Co-pay amount for Eliquis 74m bid ,and Xarelto 264mdaily  $47.00 for a 30day supply.  No PA required Deductible not met [place] ? Retail [facility]: [facility]

## 2019-12-04 NOTE — Procedures (Addendum)
Interventional Radiology Procedure Note  Procedure: Caval and RLE mechanical thrombectomy  Complications: None  Estimated Blood Loss: 100 mL  Recommendations: - Bedrest with legs straight x 4 hrs - Continue heparin, transition to weight based Lovenox prior to DC.  Recommend loevnox x 6 weeks prior to NOAC if possible.  - Popliteal suture removal tomorrow afternoon        Signed,  Criselda Peaches, MD

## 2019-12-05 LAB — LUPUS ANTICOAGULANT PANEL
DRVVT: 153.8 s — ABNORMAL HIGH (ref 0.0–47.0)
PTT Lupus Anticoagulant: 51.3 s (ref 0.0–51.9)

## 2019-12-05 LAB — CARDIOLIPIN ANTIBODIES, IGG, IGM, IGA
Anticardiolipin IgA: <9 APL U/mL (ref 0–11)
Anticardiolipin IgG: <9 GPL U/mL (ref 0–14)
Anticardiolipin IgM: <9 MPL U/mL (ref 0–12)

## 2019-12-05 LAB — CBC
HCT: 33.7 % — ABNORMAL LOW (ref 36.0–46.0)
Hemoglobin: 10.9 g/dL — ABNORMAL LOW (ref 12.0–15.0)
MCH: 31.2 pg (ref 26.0–34.0)
MCHC: 32.3 g/dL (ref 30.0–36.0)
MCV: 96.6 fL (ref 80.0–100.0)
Platelets: 325 10*3/uL (ref 150–400)
RBC: 3.49 MIL/uL — ABNORMAL LOW (ref 3.87–5.11)
RDW: 12.3 % (ref 11.5–15.5)
WBC: 9.4 10*3/uL (ref 4.0–10.5)
nRBC: 0 % (ref 0.0–0.2)

## 2019-12-05 LAB — COMPREHENSIVE METABOLIC PANEL
ALT: 14 U/L (ref 0–44)
AST: 20 U/L (ref 15–41)
Albumin: 2.6 g/dL — ABNORMAL LOW (ref 3.5–5.0)
Alkaline Phosphatase: 80 U/L (ref 38–126)
Anion gap: 8 (ref 5–15)
BUN: 15 mg/dL (ref 8–23)
CO2: 24 mmol/L (ref 22–32)
Calcium: 8.9 mg/dL (ref 8.9–10.3)
Chloride: 107 mmol/L (ref 98–111)
Creatinine, Ser: 0.95 mg/dL (ref 0.44–1.00)
GFR calc Af Amer: >60 mL/min (ref >60)
GFR calc non Af Amer: >60 mL/min (ref >60)
Glucose, Bld: 116 mg/dL — ABNORMAL HIGH (ref 70–99)
Potassium: 3.5 mmol/L (ref 3.5–5.1)
Sodium: 139 mmol/L (ref 135–145)
Total Bilirubin: 1.1 mg/dL (ref 0.3–1.2)
Total Protein: 5.7 g/dL — ABNORMAL LOW (ref 6.5–8.1)

## 2019-12-05 LAB — BETA-2-GLYCOPROTEIN I ABS, IGG/M/A
Beta-2 Glyco I IgG: <9 GPI IgG units (ref 0–20)
Beta-2-Glycoprotein I IgA: <9 GPI IgA units (ref 0–25)
Beta-2-Glycoprotein I IgM: <9 GPI IgM units (ref 0–32)

## 2019-12-05 LAB — DRVVT MIX: dRVVT Mix: 101.5 s — ABNORMAL HIGH (ref 0.0–40.4)

## 2019-12-05 LAB — MAGNESIUM: Magnesium: 1.8 mg/dL (ref 1.7–2.4)

## 2019-12-05 LAB — APTT
aPTT: 21 seconds — ABNORMAL LOW (ref 24–36)
aPTT: 80 seconds — ABNORMAL HIGH (ref 24–36)
aPTT: 42 seconds — ABNORMAL HIGH (ref 24–36)

## 2019-12-05 LAB — DRVVT CONFIRM: dRVVT Confirm: 1.6 ratio — ABNORMAL HIGH (ref 0.8–1.2)

## 2019-12-05 LAB — PROTHROMBIN GENE MUTATION

## 2019-12-05 LAB — HEPARIN LEVEL (UNFRACTIONATED): Heparin Unfractionated: 1.1 IU/mL — ABNORMAL HIGH (ref 0.30–0.70)

## 2019-12-05 LAB — PROTEIN C, TOTAL: Protein C, Total: 113 % (ref 60–150)

## 2019-12-05 LAB — FACTOR 5 LEIDEN

## 2019-12-05 MED ORDER — ENOXAPARIN SODIUM 60 MG/0.6ML ~~LOC~~ SOLN: 60.0000 mg | Freq: Two times a day (BID) | SUBCUTANEOUS | 0 refills | Status: AC

## 2019-12-05 MED ORDER — ENOXAPARIN SODIUM 60 MG/0.6ML ~~LOC~~ SOLN
60.0000 mg | Freq: Two times a day (BID) | SUBCUTANEOUS | Status: DC
  Administered 2019-12-05: 60 mg via SUBCUTANEOUS
  Filled 2019-12-05 (×2): qty 0.6

## 2019-12-05 MED FILL — ENOXAPARIN 60 MG/0.6 ML SYR: 60 | 42 days supply | Qty: 50 | Fill #0

## 2019-12-05 NOTE — Discharge Summary (Signed)
Physician Discharge Summary  Belinda Perry [id number] DOB: [birth year] DOA: 12/03/2019  PCP: Lavone Orn, MD  Admit date: 12/03/2019 Discharge date: 12/05/2019  Admitted From: Home Disposition: Home  Recommendations for Outpatient Follow-up:  1. Follow up with PCP in 1 week with repeat CBC/BMP 2. Outpatient follow-up with IR 3. Follow up in ED if symptoms worsen or new appear   Home Health: No Equipment/Devices: None  Discharge Condition: Stable CODE STATUS: Full Diet recommendation: Heart healthy  Brief/Interim Summary: 74 year old female with history of hypertension, hyperlipidemia, skin cancer, allergies, prior tobacco abuse quit in 2012, vitamin D deficiency, family history of DVT presented from PCPs office due to right lower extremity DVT diagnosed at PCPs office to rule out PE.  CTA of the chest showed nonocclusive segmental pulmonary embolism involving the right lower lobe, no CT evidence of heart strain.  CT of the abdomen and pelvis showed extensive nonocclusive thrombus involving the right common femoral with extension in the right external and common iliac veins, extensive nonocclusive thrombus involving the IVC to the level of the left renal vein. Indeterminate mass centered within the uterine fundus measuring approximately 3.6 cm. Follow-up with nonemergent pelvic ultrasound recommended. Large left-sided heterogeneous thyroid nodule measuring at least 3.2 cm. And nonemergent outpatient thyroid ultrasound recommended.  Patient was started on IV heparin.  IR was consulted and recommended transfer to [facility].  She underwent caval and RLE mechanical thrombectomy by IR on 12/04/2019.  Subsequently, IR has recommended Lovenox weight-based for 6 weeks with outpatient follow-up with IR.  IR has cleared the patient for discharge.  She will be discharged home today.   Discharge Diagnoses:   Extensive right lower extremity DVT(right common femoral vein with extension into  right external and common iliac veins. Extensive nonocclusive thrombus involving the IVC to the level of the left renal vein) Acute nonocclusive segmental pulmonary embolus involving right lower lobe -Questionable etiology.  Looks like unprovoked DVT and PE -No history of recent car travel, recent surgeries, recent immobility or prior history of DVT.  Has history of DVT and grandmother -CT of the abdomen and pelvis was concerning for uterine mass but pelvic ultrasound revealed that it was a fibroid. -Treated with heparin drip. - She underwent caval and RLE mechanical thrombectomy by IR on 12/04/2019.  Subsequently, IR has recommended Lovenox weight-based for 6 weeks.  She will be discharged on Lovenox 60 mg subcutaneous twice a day for 6 weeks with outpatient follow-up with IR.  IR has cleared the patient for discharge.  She will be discharged home today.  Uterine fibroid -CT of the abdomen and pelvis showed probable uterine mass.  Pelvic ultrasound showed possible uterine fibroid.  Outpatient follow-up with GYN  Thyroid nodule -Recommend outpatient thyroid ultrasound  Hypertension -Blood pressure on the lower side.  Outpatient follow-up.  Hyperlipidemia -Continue statin  Allergic rhinitis -continue Claritin, Flonase.  Outpatient follow-up  Discharge Instructions  Discharge Instructions    Diet - low sodium heart healthy   Complete by: As directed    Increase activity slowly   Complete by: As directed      Allergies as of 12/05/2019      Reactions   Codeine    "makes me feel hyper"      Medication List    TAKE these medications   BIOTIN PO Take 1 tablet by mouth.   cetirizine 10 MG tablet Commonly known as: ZYRTEC Take 10 mg by mouth daily as needed for allergies.   enoxaparin 60 MG/0.6ML injection  Commonly known as: LOVENOX Inject 0.6 mLs (60 mg total) into the skin every 12 (twelve) hours.   fluticasone 50 MCG/ACT nasal spray Commonly known as: FLONASE Place 2  sprays into both nostrils daily as needed for allergies or rhinitis.   Multivital Chew Chew 1 tablet by mouth daily.   potassium chloride 10 MEQ tablet Commonly known as: KLOR-CON Take 10 mEq by mouth daily.   PROBIOTIC DAILY PO Take 1 tablet by mouth.   simvastatin 40 MG tablet Commonly known as: ZOCOR TAKE 1 TABLET (40 MG TOTAL) BY MOUTH AT BEDTIME. What changed: See the new instructions.      Follow-up Information    Jacqulynn Cadet, MD Follow up.   Specialties: Interventional Radiology, Radiology Why: IR scheduler will call you with appointment date/time (~6 weeks from time of your procedure). Please call with any questions or concerns prior to your appointment. Contact information: [place] [address] [phone]        Lavone Orn, MD. Schedule an appointment as soon as possible for a visit in 1 week(s).   Specialty: Internal Medicine Contact information: [address] [place] [address] [phone]          Allergies  Allergen Reactions  . Codeine     "makes me feel hyper"    Consultations:  IR   Procedures/Studies: CT Angio Chest PE W and/or Wo Contrast  Result Date: 12/03/2019 CLINICAL DATA:  Shortness of breath and decreased appetite. Known lower extremity DVT. EXAM: CT ANGIOGRAPHY CHEST CT ABDOMEN AND PELVIS WITH CONTRAST TECHNIQUE: Multidetector CT imaging of the chest was performed using the standard protocol during bolus administration of intravenous contrast. Multiplanar CT image reconstructions and MIPs were obtained to evaluate the vascular anatomy. Multidetector CT imaging of the abdomen and pelvis was performed using the standard protocol during bolus administration of intravenous contrast. CONTRAST:  52mL OMNIPAQUE IOHEXOL 350 MG/ML SOLN COMPARISON:  None. FINDINGS: CTA CHEST FINDINGS Cardiovascular: Contrast injection is sufficient to demonstrate satisfactory opacification of the pulmonary  arteries to the segmental level.There is an acute appearing segmental pulmonary embolus involving the right lower lobe (axial series 1, image 86). The main pulmonary artery is within normal limits for size. There is no CT evidence of acute right heart strain. There are atherosclerotic changes of the thoracic aorta without evidence for an aneurysm or dissection. Heart size is normal, without pericardial effusion. Mediastinum/Nodes: --No mediastinal or hilar lymphadenopathy. --No axillary lymphadenopathy. --No supraclavicular lymphadenopathy. --the left thyroid gland is enlarged with a dominant left-sided thyroid nodule that appears to be a measuring at least 3.2 cm. --The esophagus is unremarkable Lungs/Pleura: There are mild emphysematous changes bilaterally. There is no pneumothorax. No significant pleural effusion. The trachea is unremarkable. Musculoskeletal: No chest wall abnormality. No acute or significant osseous findings. Appears to be at least 1 left-sided breast nodule measuring approximately 1.2 cm (axial series 4, image 138). This was likely previously evaluated on the patient's recent screening mammogram and as such no further follow-up is required. Review of the MIP images confirms the above findings. CT ABDOMEN and PELVIS FINDINGS Hepatobiliary: The liver is normal. Normal gallbladder.There is no biliary ductal dilation. Pancreas: Normal contours without ductal dilatation. No peripancreatic fluid collection. Spleen: Unremarkable. Adrenals/Urinary Tract: --Adrenal glands: Unremarkable. --Right kidney/ureter: No hydronephrosis or radiopaque kidney stones. --Left kidney/ureter: Multiple cortical cysts are noted. There is no hydronephrosis. --Urinary bladder: Unremarkable. Stomach/Bowel: --Stomach/Duodenum: No hiatal hernia or other gastric abnormality. Normal duodenal course and  caliber. --Small bowel: Unremarkable. --Colon: Rectosigmoid diverticulosis without acute inflammation. --Appendix: Normal.  Vascular/Lymphatic: Atherosclerotic calcification is present within the non-aneurysmal abdominal aorta, without hemodynamically significant stenosis. There is extensive nonocclusive thrombus from the right common femoral vein extending into the right external and common iliac veins into the IVC. The thrombus terminates at the level of the left renal vein. --No retroperitoneal lymphadenopathy. --No mesenteric lymphadenopathy. --No pelvic or inguinal lymphadenopathy. Reproductive: There is an indeterminate mass that appears to be centered within the uterine fundus measuring approximately 3.4 x 3.6 cm. Other: No ascites or free air. The abdominal wall is normal. Musculoskeletal. No acute displaced fractures. 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 There is a chronic appearing compression fracture of the T12 vertebral body. Review of the MIP images confirms the above findings. IMPRESSION: 1. There is an acute nonocclusive segmental pulmonary embolus involving the right lower lobe. There is no CT evidence for heart strain. 2. Extensive nonocclusive thrombus is noted involving the right common femoral vein with extension into the right external and common iliac veins. There is extensive nonocclusive thrombus involving the IVC to the level of the left renal vein. 3. Indeterminate mass centered within the uterine fundus measuring approximately 3.6 cm. Follow-up with a nonemergent pelvic ultrasound is recommended for further evaluation of this finding. 4. Large left-sided heterogeneous thyroid nodule measuring at least 3.2 cm. A nonemergent outpatient thyroid ultrasound is recommended for further evaluation of this finding.(Ref: J Am Coll Radiol. 2015 Feb;12(2): 143-50). Aortic Atherosclerosis (ICD10-I70.0) and Emphysema (ICD10-J43.9). These results were called by telephone at the time of interpretation on 12/03/2019 at 5:06 pm to provider [facility] , who verbally acknowledged these results. Electronically  Signed   By: Constance Holster M.D.   On: 12/03/2019 17:19   CT ABDOMEN PELVIS W CONTRAST  Result Date: 12/03/2019 CLINICAL DATA:  Shortness of breath and decreased appetite. Known lower extremity DVT. EXAM: CT ANGIOGRAPHY CHEST CT ABDOMEN AND PELVIS WITH CONTRAST TECHNIQUE: Multidetector CT imaging of the chest was performed using the standard protocol during bolus administration of intravenous contrast. Multiplanar CT image reconstructions and MIPs were obtained to evaluate the vascular anatomy. Multidetector CT imaging of the abdomen and pelvis was performed using the standard protocol during bolus administration of intravenous contrast. CONTRAST:  71mL OMNIPAQUE IOHEXOL 350 MG/ML SOLN COMPARISON:  None. FINDINGS: CTA CHEST FINDINGS Cardiovascular: Contrast injection is sufficient to demonstrate satisfactory opacification of the pulmonary arteries to the segmental level.There is an acute appearing segmental pulmonary embolus involving the right lower lobe (axial series 1, image 86). The main pulmonary artery is within normal limits for size. There is no CT evidence of acute right heart strain. There are atherosclerotic changes of the thoracic aorta without evidence for an aneurysm or dissection. Heart size is normal, without pericardial effusion. Mediastinum/Nodes: --No mediastinal or hilar lymphadenopathy. --No axillary lymphadenopathy. --No supraclavicular lymphadenopathy. --the left thyroid gland is enlarged with a dominant left-sided thyroid nodule that appears to be a measuring at least 3.2 cm. --The esophagus is unremarkable Lungs/Pleura: There are mild emphysematous changes bilaterally. There is no pneumothorax. No significant pleural effusion. The trachea is unremarkable. Musculoskeletal: No chest wall abnormality. No acute or significant osseous findings. Appears to be at least 1 left-sided breast nodule measuring approximately 1.2 cm (axial series 4, image 138). This was likely previously evaluated  on the patient's recent screening mammogram and as such no further follow-up is required. Review of the MIP images confirms the above findings. CT ABDOMEN  and PELVIS FINDINGS Hepatobiliary: The liver is normal. Normal gallbladder.There is no biliary ductal dilation. Pancreas: Normal contours without ductal dilatation. No peripancreatic fluid collection. Spleen: Unremarkable. Adrenals/Urinary Tract: --Adrenal glands: Unremarkable. --Right kidney/ureter: No hydronephrosis or radiopaque kidney stones. --Left kidney/ureter: Multiple cortical cysts are noted. There is no hydronephrosis. --Urinary bladder: Unremarkable. Stomach/Bowel: --Stomach/Duodenum: No hiatal hernia or other gastric abnormality. Normal duodenal course and caliber. --Small bowel: Unremarkable. --Colon: Rectosigmoid diverticulosis without acute inflammation. --Appendix: Normal. Vascular/Lymphatic: Atherosclerotic calcification is present within the non-aneurysmal abdominal aorta, without hemodynamically significant stenosis. There is extensive nonocclusive thrombus from the right common femoral vein extending into the right external and common iliac veins into the IVC. The thrombus terminates at the level of the left renal vein. --No retroperitoneal lymphadenopathy. --No mesenteric lymphadenopathy. --No pelvic or inguinal lymphadenopathy. Reproductive: There is an indeterminate mass that appears to be centered within the uterine fundus measuring approximately 3.4 x 3.6 cm. Other: No ascites or free air. The abdominal wall is normal. Musculoskeletal. No acute displaced fractures. 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 There is a chronic appearing compression fracture of the T12 vertebral body. Review of the MIP images confirms the above findings. IMPRESSION: 1. There is an acute nonocclusive segmental pulmonary embolus involving the right lower lobe. There is no CT evidence for heart strain. 2. Extensive nonocclusive thrombus is noted  involving the right common femoral vein with extension into the right external and common iliac veins. There is extensive nonocclusive thrombus involving the IVC to the level of the left renal vein. 3. Indeterminate mass centered within the uterine fundus measuring approximately 3.6 cm. Follow-up with a nonemergent pelvic ultrasound is recommended for further evaluation of this finding. 4. Large left-sided heterogeneous thyroid nodule measuring at least 3.2 cm. A nonemergent outpatient thyroid ultrasound is recommended for further evaluation of this finding.(Ref: J Am Coll Radiol. 2015 Feb;12(2): 143-50). Aortic Atherosclerosis (ICD10-I70.0) and Emphysema (ICD10-J43.9). These results were called by telephone at the time of interpretation on 12/03/2019 at 5:06 pm to provider [facility] , who verbally acknowledged these results. Electronically Signed   By: Constance Holster M.D.   On: 12/03/2019 17:19   IR Veno/Ext/Uni Left  Result Date: 12/05/2019 INDICATION: 74 year old female with massive right lower extremity DVT and caval thrombus extending to the level of the renal veins. Her clot and symptoms are subacute having begun nearly 1 month previously after receiving the COVID vaccine. EXAM: THROMBOECTOMY MECHANICAL VENOUS; IR ULTRASOUND GUIDANCE VASC ACCESS LEFT; LEFT EXTREMITY VENOGRAPHY; IR ULTRASOUND GUIDANCE VASC ACCESS RIGHT 1. Ultrasound-guided vascular access left popliteal vein 2. Ultrasound-guided vascular access right popliteal vein 3. Left lower extremity venogram 4. Right lower extremity venogram 5. Catheterization of the vena cava with vena cavagram 6. Catheterization of the left subclavian vein 7. Catheterization of the right subclavian vein 8. Mechanical extirpation of matter from the inferior vena cava, right common iliac vein, right external iliac vein, right common femoral vein, right femoral vein 9. Balloon angioplasty of right common femoral vein to 14 mm COMPARISON:  CT abdomen/pelvis  12/03/2019 MEDICATIONS: Heparin 9000 units ANESTHESIA/SEDATION: Versed 3 mg IV; Fentanyl 175 mcg IV Moderate Sedation Time:  168 minutes The patient was continuously monitored during the procedure by the interventional radiology nurse under my direct supervision. FLUOROSCOPY TIME:  Fluoroscopy Time: 34 minutes 0 seconds (380 mGy). COMPLICATIONS: None immediate. TECHNIQUE: Informed written consent was obtained from the patient after a thorough discussion of the procedural risks, benefits and alternatives. All questions were addressed.  Maximal Sterile Barrier Technique was utilized including caps, mask, sterile gowns, sterile gloves, sterile drape, hand hygiene and skin antiseptic. A timeout was performed prior to the initiation of the procedure. The left popliteal vein was interrogated with ultrasound and found to be widely patent. An image was obtained and stored for the medical record. Local anesthesia was attained by infiltration with 1% lidocaine. A small dermatotomy was made. Under real-time sonographic guidance, the vessel was punctured with a 21 gauge micropuncture needle. Using standard technique, the initial micro needle was exchanged over a 0.018 micro wire for a transitional 4 [place] micro sheath. The micro sheath was then exchanged over a 0.035 wire for a 9 French vascular sheath. The right popliteal vein was interrogated with ultrasound and found to be completely thrombosed and expanded. An image was obtained and stored for the medical record. Local anesthesia was attained by infiltration with 1% lidocaine. A small dermatotomy was made. Under real-time sonographic guidance, the vessel was punctured with a 21 gauge micropuncture needle. Using standard technique, the initial micro needle was exchanged over a 0.018 micro wire for a transitional 4 [place] micro sheath. The micro sheath was then exchanged over a 0.035 wire for a 9 French sheath. A left lower extremity venogram was performed. The left popliteal  and femoral vein are widely patent. There is excellent flow. No evidence of deep venous thrombosis. A right lower extremity venogram was performed. The right popliteal and visible portions of the femoral vein are completely thrombosed. There is no antegrade flow. An angled catheter was carefully advanced over a Bentson wire through the right popliteal sheath and into the common femoral vein. Contrast injection was again performed. Significant thrombus present within the common femoral vein. There is high-grade stenosis versus occlusion of the external iliac vein. Collateral formation is present. There is some evidence of flow in the common iliac vein. The catheter was successfully navigated into the inferior vena cava at the bifurcation. A contrast injection was performed. The left common iliac vein is patent. Massive thrombus extends from the right internal iliac vein into the inferior vena cava to the level of the renal veins. The conical thrombus is nonocclusive. There is flow through the vena cava. Utilizing the angled catheter and a Glidewire, the catheter was carefully navigated past the thrombus, through the right heart and into the left subclavian vein. A hand injection of contrast material was performed confirming that the catheter is within the subclavian vein in a good location. No evidence of thrombus. A superstiff Amplatz wire was deployed into the left axillary vein. The catheter was removed and the wire left in place. The catheter was then introduced through the left popliteal sheath. Utilizing similar technique, the catheter was successfully navigated past the caval thrombus, through the heart and into the right subclavian vein. A gentle hand injection of contrast material was performed confirming that the catheter is in a good position within the subclavian vein. A second superstiff Amplatz wire was advanced into the right subclavian vein. The 5 French catheter was removed. The left popliteal sheath  was then removed over the wire and serial dilation was performed up to 12 [place]. A Cook flexor 44 French sheath was then advanced over the wire and positioned in the right common iliac vein. At this time, the ACT was checked and found to be 230, sufficient anticoagulation. The 28 mm FlowTriever disc system was advanced over the wire and the discs were deployed in the intrahepatic IVC. Attention was then turned  to the right popliteal sheath. This 9 French sheath was removed and serial dilation was performed up to 16 [place]. The 10 French Clotreiver sheath was then carefully advanced through the popliteal vein and into the femoral vein in the distal thigh. The dilator was removed and the recovery cone successfully deployed. The Clotriever system was then advanced into the suprarenal IVC, opened and carefully pulled back through the IVC, right common iliac vein, right external iliac vein, common femoral vein and femoral vein. Significant extirpation of matter was achieved. The thrombus material is a combination of acute, subacute and chronic material. There is a significant amount of subacute to chronic thrombus. This procedure was then performed several more times each time extra paving more and more matter from the cava, iliac and femoral veins. Contrast injection from the left popliteal 12 French sheath demonstrates successful clearing of the vena cava. No residual thrombus. Right lower extremity venography demonstrates significant residual chronic thrombus in the right common femoral vein with associated stenosis. Balloon angioplasty was then performed first using a 12 x 4 mm Conquest balloon. Repeat venography demonstrated improved flow, but some persistent narrowing. Therefore, additional angioplasty was performed using a 14 x 40 mm atlas balloon. The ampulla spoon was inflated to full effacement and maintained for 90 seconds. Follow-up venography demonstrates significant improvement in venous stenosis with  excellent outflow. No evidence of iliac venous stenosis. At this point, the right lower extremity appeared free from thrombus and as did the inferior vena cava. Antegrade flow was restored. The discs were collapsed and removed. The wires were removed. Both sheaths were removed and hemostasis attained by gentle manual pressure in combination with 0 silk pursestring sutures. FINDINGS: Successful extirpation of matter from the inferior vena cava, right common iliac vein, right external iliac vein, right common femoral vein and femoral vein. The matter consisted of a combination of acute, subacute and chronic thrombus. Successful restoration of antegrade flow. IMPRESSION: Successful mechanical extirpation of matter from the inferior vena cava and right lower extremity with restoration of antegrade flow. Signed, Criselda Peaches, MD, [facility] Radiology Electronically Signed   By: Jacqulynn Cadet M.D.   On: 12/05/2019 10:11   IR THROMBECT VENO MECH MOD SED  Result Date: 12/05/2019 INDICATION: 74 year old female with massive right lower extremity DVT and caval thrombus extending to the level of the renal veins. Her clot and symptoms are subacute having begun nearly 1 month previously after receiving the COVID vaccine. EXAM: THROMBOECTOMY MECHANICAL VENOUS; IR ULTRASOUND GUIDANCE VASC ACCESS LEFT; LEFT EXTREMITY VENOGRAPHY; IR ULTRASOUND GUIDANCE VASC ACCESS RIGHT 1. Ultrasound-guided vascular access left popliteal vein 2. Ultrasound-guided vascular access right popliteal vein 3. Left lower extremity venogram 4. Right lower extremity venogram 5. Catheterization of the vena cava with vena cavagram 6. Catheterization of the left subclavian vein 7. Catheterization of the right subclavian vein 8. Mechanical extirpation of matter from the inferior vena cava, right common iliac vein, right external iliac vein, right common femoral vein, right femoral vein 9. Balloon  angioplasty of right common femoral vein to 14 mm COMPARISON:  CT abdomen/pelvis 12/03/2019 MEDICATIONS: Heparin 9000 units ANESTHESIA/SEDATION: Versed 3 mg IV; Fentanyl 175 mcg IV Moderate Sedation Time:  168 minutes The patient was continuously monitored during the procedure by the interventional radiology nurse under my direct supervision. FLUOROSCOPY TIME:  Fluoroscopy Time: 34 minutes 0 seconds (380 mGy). COMPLICATIONS: None immediate. TECHNIQUE: Informed written consent was obtained from the patient after a thorough discussion of the  procedural risks, benefits and alternatives. All questions were addressed. Maximal Sterile Barrier Technique was utilized including caps, mask, sterile gowns, sterile gloves, sterile drape, hand hygiene and skin antiseptic. A timeout was performed prior to the initiation of the procedure. The left popliteal vein was interrogated with ultrasound and found to be widely patent. An image was obtained and stored for the medical record. Local anesthesia was attained by infiltration with 1% lidocaine. A small dermatotomy was made. Under real-time sonographic guidance, the vessel was punctured with a 21 gauge micropuncture needle. Using standard technique, the initial micro needle was exchanged over a 0.018 micro wire for a transitional 4 [place] micro sheath. The micro sheath was then exchanged over a 0.035 wire for a 9 French vascular sheath. The right popliteal vein was interrogated with ultrasound and found to be completely thrombosed and expanded. An image was obtained and stored for the medical record. Local anesthesia was attained by infiltration with 1% lidocaine. A small dermatotomy was made. Under real-time sonographic guidance, the vessel was punctured with a 21 gauge micropuncture needle. Using standard technique, the initial micro needle was exchanged over a 0.018 micro wire for a transitional 4 [place] micro sheath. The micro sheath was then exchanged over a 0.035 wire for a 9  French sheath. A left lower extremity venogram was performed. The left popliteal and femoral vein are widely patent. There is excellent flow. No evidence of deep venous thrombosis. A right lower extremity venogram was performed. The right popliteal and visible portions of the femoral vein are completely thrombosed. There is no antegrade flow. An angled catheter was carefully advanced over a Bentson wire through the right popliteal sheath and into the common femoral vein. Contrast injection was again performed. Significant thrombus present within the common femoral vein. There is high-grade stenosis versus occlusion of the external iliac vein. Collateral formation is present. There is some evidence of flow in the common iliac vein. The catheter was successfully navigated into the inferior vena cava at the bifurcation. A contrast injection was performed. The left common iliac vein is patent. Massive thrombus extends from the right internal iliac vein into the inferior vena cava to the level of the renal veins. The conical thrombus is nonocclusive. There is flow through the vena cava. Utilizing the angled catheter and a Glidewire, the catheter was carefully navigated past the thrombus, through the right heart and into the left subclavian vein. A hand injection of contrast material was performed confirming that the catheter is within the subclavian vein in a good location. No evidence of thrombus. A superstiff Amplatz wire was deployed into the left axillary vein. The catheter was removed and the wire left in place. The catheter was then introduced through the left popliteal sheath. Utilizing similar technique, the catheter was successfully navigated past the caval thrombus, through the heart and into the right subclavian vein. A gentle hand injection of contrast material was performed confirming that the catheter is in a good position within the subclavian vein. A second superstiff Amplatz wire was advanced into the  right subclavian vein. The 5 French catheter was removed. The left popliteal sheath was then removed over the wire and serial dilation was performed up to 12 [place]. A Cook flexor 15 French sheath was then advanced over the wire and positioned in the right common iliac vein. At this time, the ACT was checked and found to be 230, sufficient anticoagulation. The 28 mm FlowTriever disc system was advanced over the wire and the discs were  deployed in the intrahepatic IVC. Attention was then turned to the right popliteal sheath. This 9 French sheath was removed and serial dilation was performed up to 16 [place]. The 87 French Clotreiver sheath was then carefully advanced through the popliteal vein and into the femoral vein in the distal thigh. The dilator was removed and the recovery cone successfully deployed. The Clotriever system was then advanced into the suprarenal IVC, opened and carefully pulled back through the IVC, right common iliac vein, right external iliac vein, common femoral vein and femoral vein. Significant extirpation of matter was achieved. The thrombus material is a combination of acute, subacute and chronic material. There is a significant amount of subacute to chronic thrombus. This procedure was then performed several more times each time extra paving more and more matter from the cava, iliac and femoral veins. Contrast injection from the left popliteal 12 French sheath demonstrates successful clearing of the vena cava. No residual thrombus. Right lower extremity venography demonstrates significant residual chronic thrombus in the right common femoral vein with associated stenosis. Balloon angioplasty was then performed first using a 12 x 4 mm Conquest balloon. Repeat venography demonstrated improved flow, but some persistent narrowing. Therefore, additional angioplasty was performed using a 14 x 40 mm atlas balloon. The ampulla spoon was inflated to full effacement and maintained for 90 seconds.  Follow-up venography demonstrates significant improvement in venous stenosis with excellent outflow. No evidence of iliac venous stenosis. At this point, the right lower extremity appeared free from thrombus and as did the inferior vena cava. Antegrade flow was restored. The discs were collapsed and removed. The wires were removed. Both sheaths were removed and hemostasis attained by gentle manual pressure in combination with 0 silk pursestring sutures. FINDINGS: Successful extirpation of matter from the inferior vena cava, right common iliac vein, right external iliac vein, right common femoral vein and femoral vein. The matter consisted of a combination of acute, subacute and chronic thrombus. Successful restoration of antegrade flow. IMPRESSION: Successful mechanical extirpation of matter from the inferior vena cava and right lower extremity with restoration of antegrade flow. Signed, Criselda Peaches, MD, [place] Electronically Signed   By: Jacqulynn Cadet M.D.   On: 12/05/2019 10:11   IR US Guide Vasc Access Left  Result Date: 12/05/2019 INDICATION: 74 year old female with massive right lower extremity DVT and caval thrombus extending to the level of the renal veins. Her clot and symptoms are subacute having begun nearly 1 month previously after receiving the COVID vaccine. EXAM: THROMBOECTOMY MECHANICAL VENOUS; IR ULTRASOUND GUIDANCE VASC ACCESS LEFT; LEFT EXTREMITY VENOGRAPHY; IR ULTRASOUND GUIDANCE VASC ACCESS RIGHT 1. Ultrasound-guided vascular access left popliteal vein 2. Ultrasound-guided vascular access right popliteal vein 3. Left lower extremity venogram 4. Right lower extremity venogram 5. Catheterization of the vena cava with vena cavagram 6. Catheterization of the left subclavian vein 7. Catheterization of the right subclavian vein 8. Mechanical extirpation of matter from the inferior vena cava, right common iliac vein,  right external iliac vein, right common femoral vein, right femoral vein 9. Balloon angioplasty of right common femoral vein to 14 mm COMPARISON:  CT abdomen/pelvis 12/03/2019 MEDICATIONS: Heparin 9000 units ANESTHESIA/SEDATION: Versed 3 mg IV; Fentanyl 175 mcg IV Moderate Sedation Time:  168 minutes The patient was continuously monitored during the procedure by the interventional radiology nurse under my direct supervision. FLUOROSCOPY TIME:  Fluoroscopy Time: 34 minutes 0 seconds (380 mGy). COMPLICATIONS: None immediate. TECHNIQUE: Informed written consent was obtained  from the patient after a thorough discussion of the procedural risks, benefits and alternatives. All questions were addressed. Maximal Sterile Barrier Technique was utilized including caps, mask, sterile gowns, sterile gloves, sterile drape, hand hygiene and skin antiseptic. A timeout was performed prior to the initiation of the procedure. The left popliteal vein was interrogated with ultrasound and found to be widely patent. An image was obtained and stored for the medical record. Local anesthesia was attained by infiltration with 1% lidocaine. A small dermatotomy was made. Under real-time sonographic guidance, the vessel was punctured with a 21 gauge micropuncture needle. Using standard technique, the initial micro needle was exchanged over a 0.018 micro wire for a transitional 4 [place] micro sheath. The micro sheath was then exchanged over a 0.035 wire for a 9 French vascular sheath. The right popliteal vein was interrogated with ultrasound and found to be completely thrombosed and expanded. An image was obtained and stored for the medical record. Local anesthesia was attained by infiltration with 1% lidocaine. A small dermatotomy was made. Under real-time sonographic guidance, the vessel was punctured with a 21 gauge micropuncture needle. Using standard technique, the initial micro needle was exchanged over a 0.018 micro wire for a transitional  4 [place] micro sheath. The micro sheath was then exchanged over a 0.035 wire for a 9 French sheath. A left lower extremity venogram was performed. The left popliteal and femoral vein are widely patent. There is excellent flow. No evidence of deep venous thrombosis. A right lower extremity venogram was performed. The right popliteal and visible portions of the femoral vein are completely thrombosed. There is no antegrade flow. An angled catheter was carefully advanced over a Bentson wire through the right popliteal sheath and into the common femoral vein. Contrast injection was again performed. Significant thrombus present within the common femoral vein. There is high-grade stenosis versus occlusion of the external iliac vein. Collateral formation is present. There is some evidence of flow in the common iliac vein. The catheter was successfully navigated into the inferior vena cava at the bifurcation. A contrast injection was performed. The left common iliac vein is patent. Massive thrombus extends from the right internal iliac vein into the inferior vena cava to the level of the renal veins. The conical thrombus is nonocclusive. There is flow through the vena cava. Utilizing the angled catheter and a Glidewire, the catheter was carefully navigated past the thrombus, through the right heart and into the left subclavian vein. A hand injection of contrast material was performed confirming that the catheter is within the subclavian vein in a good location. No evidence of thrombus. A superstiff Amplatz wire was deployed into the left axillary vein. The catheter was removed and the wire left in place. The catheter was then introduced through the left popliteal sheath. Utilizing similar technique, the catheter was successfully navigated past the caval thrombus, through the heart and into the right subclavian vein. A gentle hand injection of contrast material was performed confirming that the catheter is in a good position  within the subclavian vein. A second superstiff Amplatz wire was advanced into the right subclavian vein. The 5 French catheter was removed. The left popliteal sheath was then removed over the wire and serial dilation was performed up to 12 [place]. A Cook flexor 10 French sheath was then advanced over the wire and positioned in the right common iliac vein. At this time, the ACT was checked and found to be 230, sufficient anticoagulation. The 28 mm FlowTriever disc system  was advanced over the wire and the discs were deployed in the intrahepatic IVC. Attention was then turned to the right popliteal sheath. This 9 French sheath was removed and serial dilation was performed up to 16 [place]. The 42 French Clotreiver sheath was then carefully advanced through the popliteal vein and into the femoral vein in the distal thigh. The dilator was removed and the recovery cone successfully deployed. The Clotriever system was then advanced into the suprarenal IVC, opened and carefully pulled back through the IVC, right common iliac vein, right external iliac vein, common femoral vein and femoral vein. Significant extirpation of matter was achieved. The thrombus material is a combination of acute, subacute and chronic material. There is a significant amount of subacute to chronic thrombus. This procedure was then performed several more times each time extra paving more and more matter from the cava, iliac and femoral veins. Contrast injection from the left popliteal 12 French sheath demonstrates successful clearing of the vena cava. No residual thrombus. Right lower extremity venography demonstrates significant residual chronic thrombus in the right common femoral vein with associated stenosis. Balloon angioplasty was then performed first using a 12 x 4 mm Conquest balloon. Repeat venography demonstrated improved flow, but some persistent narrowing. Therefore, additional angioplasty was performed using a 14 x 40 mm atlas balloon.  The ampulla spoon was inflated to full effacement and maintained for 90 seconds. Follow-up venography demonstrates significant improvement in venous stenosis with excellent outflow. No evidence of iliac venous stenosis. At this point, the right lower extremity appeared free from thrombus and as did the inferior vena cava. Antegrade flow was restored. The discs were collapsed and removed. The wires were removed. Both sheaths were removed and hemostasis attained by gentle manual pressure in combination with 0 silk pursestring sutures. FINDINGS: Successful extirpation of matter from the inferior vena cava, right common iliac vein, right external iliac vein, right common femoral vein and femoral vein. The matter consisted of a combination of acute, subacute and chronic thrombus. Successful restoration of antegrade flow. IMPRESSION: Successful mechanical extirpation of matter from the inferior vena cava and right lower extremity with restoration of antegrade flow. Signed, Criselda Peaches, MD, [place] [facility] Electronically Signed   By: Jacqulynn Cadet M.D.   On: 12/05/2019 10:11   IR US Guide Vasc Access Right  Result Date: 12/05/2019 INDICATION: 74 year old female with massive right lower extremity DVT and caval thrombus extending to the level of the renal veins. Her clot and symptoms are subacute having begun nearly 1 month previously after receiving the COVID vaccine. EXAM: THROMBOECTOMY MECHANICAL VENOUS; IR ULTRASOUND GUIDANCE VASC ACCESS LEFT; LEFT EXTREMITY VENOGRAPHY; IR ULTRASOUND GUIDANCE VASC ACCESS RIGHT 1. Ultrasound-guided vascular access left popliteal vein 2. Ultrasound-guided vascular access right popliteal vein 3. Left lower extremity venogram 4. Right lower extremity venogram 5. Catheterization of the vena cava with vena cavagram 6. Catheterization of the left subclavian vein 7. Catheterization of the right subclavian vein 8.  Mechanical extirpation of matter from the inferior vena cava, right common iliac vein, right external iliac vein, right common femoral vein, right femoral vein 9. Balloon angioplasty of right common femoral vein to 14 mm COMPARISON:  CT abdomen/pelvis 12/03/2019 MEDICATIONS: Heparin 9000 units ANESTHESIA/SEDATION: Versed 3 mg IV; Fentanyl 175 mcg IV Moderate Sedation Time:  168 minutes The patient was continuously monitored during the procedure by the interventional radiology nurse under my direct supervision. FLUOROSCOPY TIME:  Fluoroscopy Time: 34 minutes 0 seconds (380 mGy).  COMPLICATIONS: None immediate. TECHNIQUE: Informed written consent was obtained from the patient after a thorough discussion of the procedural risks, benefits and alternatives. All questions were addressed. Maximal Sterile Barrier Technique was utilized including caps, mask, sterile gowns, sterile gloves, sterile drape, hand hygiene and skin antiseptic. A timeout was performed prior to the initiation of the procedure. The left popliteal vein was interrogated with ultrasound and found to be widely patent. An image was obtained and stored for the medical record. Local anesthesia was attained by infiltration with 1% lidocaine. A small dermatotomy was made. Under real-time sonographic guidance, the vessel was punctured with a 21 gauge micropuncture needle. Using standard technique, the initial micro needle was exchanged over a 0.018 micro wire for a transitional 4 [place] micro sheath. The micro sheath was then exchanged over a 0.035 wire for a 9 French vascular sheath. The right popliteal vein was interrogated with ultrasound and found to be completely thrombosed and expanded. An image was obtained and stored for the medical record. Local anesthesia was attained by infiltration with 1% lidocaine. A small dermatotomy was made. Under real-time sonographic guidance, the vessel was punctured with a 21 gauge micropuncture needle. Using standard  technique, the initial micro needle was exchanged over a 0.018 micro wire for a transitional 4 [place] micro sheath. The micro sheath was then exchanged over a 0.035 wire for a 9 French sheath. A left lower extremity venogram was performed. The left popliteal and femoral vein are widely patent. There is excellent flow. No evidence of deep venous thrombosis. A right lower extremity venogram was performed. The right popliteal and visible portions of the femoral vein are completely thrombosed. There is no antegrade flow. An angled catheter was carefully advanced over a Bentson wire through the right popliteal sheath and into the common femoral vein. Contrast injection was again performed. Significant thrombus present within the common femoral vein. There is high-grade stenosis versus occlusion of the external iliac vein. Collateral formation is present. There is some evidence of flow in the common iliac vein. The catheter was successfully navigated into the inferior vena cava at the bifurcation. A contrast injection was performed. The left common iliac vein is patent. Massive thrombus extends from the right internal iliac vein into the inferior vena cava to the level of the renal veins. The conical thrombus is nonocclusive. There is flow through the vena cava. Utilizing the angled catheter and a Glidewire, the catheter was carefully navigated past the thrombus, through the right heart and into the left subclavian vein. A hand injection of contrast material was performed confirming that the catheter is within the subclavian vein in a good location. No evidence of thrombus. A superstiff Amplatz wire was deployed into the left axillary vein. The catheter was removed and the wire left in place. The catheter was then introduced through the left popliteal sheath. Utilizing similar technique, the catheter was successfully navigated past the caval thrombus, through the heart and into the right subclavian vein. A gentle hand  injection of contrast material was performed confirming that the catheter is in a good position within the subclavian vein. A second superstiff Amplatz wire was advanced into the right subclavian vein. The 5 French catheter was removed. The left popliteal sheath was then removed over the wire and serial dilation was performed up to 12 [place]. A Cook flexor 53 French sheath was then advanced over the wire and positioned in the right common iliac vein. At this time, the ACT was checked and found to be  230, sufficient anticoagulation. The 28 mm FlowTriever disc system was advanced over the wire and the discs were deployed in the intrahepatic IVC. Attention was then turned to the right popliteal sheath. This 9 French sheath was removed and serial dilation was performed up to 16 [place]. The 82 French Clotreiver sheath was then carefully advanced through the popliteal vein and into the femoral vein in the distal thigh. The dilator was removed and the recovery cone successfully deployed. The Clotriever system was then advanced into the suprarenal IVC, opened and carefully pulled back through the IVC, right common iliac vein, right external iliac vein, common femoral vein and femoral vein. Significant extirpation of matter was achieved. The thrombus material is a combination of acute, subacute and chronic material. There is a significant amount of subacute to chronic thrombus. This procedure was then performed several more times each time extra paving more and more matter from the cava, iliac and femoral veins. Contrast injection from the left popliteal 12 French sheath demonstrates successful clearing of the vena cava. No residual thrombus. Right lower extremity venography demonstrates significant residual chronic thrombus in the right common femoral vein with associated stenosis. Balloon angioplasty was then performed first using a 12 x 4 mm Conquest balloon. Repeat venography demonstrated improved flow, but some  persistent narrowing. Therefore, additional angioplasty was performed using a 14 x 40 mm atlas balloon. The ampulla spoon was inflated to full effacement and maintained for 90 seconds. Follow-up venography demonstrates significant improvement in venous stenosis with excellent outflow. No evidence of iliac venous stenosis. At this point, the right lower extremity appeared free from thrombus and as did the inferior vena cava. Antegrade flow was restored. The discs were collapsed and removed. The wires were removed. Both sheaths were removed and hemostasis attained by gentle manual pressure in combination with 0 silk pursestring sutures. FINDINGS: Successful extirpation of matter from the inferior vena cava, right common iliac vein, right external iliac vein, right common femoral vein and femoral vein. The matter consisted of a combination of acute, subacute and chronic thrombus. Successful restoration of antegrade flow. IMPRESSION: Successful mechanical extirpation of matter from the inferior vena cava and right lower extremity with restoration of antegrade flow. Signed, Criselda Peaches, MD, [place] [facility] Electronically Signed   By: Jacqulynn Cadet M.D.   On: 12/05/2019 10:11   MM 3D SCREEN BREAST W/IMPLANT BILATERAL  Result Date: 11/11/2019 CLINICAL DATA:  Screening. EXAM: DIGITAL SCREENING BILATERAL MAMMOGRAM WITH IMPLANTS, CAD AND TOMO The patient has prepectoral implants. Standard and implant displaced views were performed. COMPARISON:  Previous exam(s). ACR Breast Density Category b: There are scattered areas of fibroglandular density. FINDINGS: There are no findings suspicious for malignancy. Images were processed with CAD. IMPRESSION: No mammographic evidence of malignancy. A result letter of this screening mammogram will be mailed directly to the patient. RECOMMENDATION: Screening mammogram in one year. (Code:[id number]) BI-RADS CATEGORY  1:   Negative. Electronically Signed   By: Kristopher Oppenheim M.D.   On: 11/11/2019 10:41   US PELVIC COMPLETE WITH TRANSVAGINAL  Result Date: 12/04/2019 CLINICAL DATA:  Initial evaluation for uterine mass seen on prior CT. EXAM: TRANSABDOMINAL AND TRANSVAGINAL ULTRASOUND OF PELVIS TECHNIQUE: Both transabdominal and transvaginal ultrasound examinations of the pelvis were performed. Transabdominal technique was performed for global imaging of the pelvis including uterus, ovaries, adnexal regions, and pelvic cul-de-sac. It was necessary to proceed with endovaginal exam following the transabdominal exam to visualize the uterus, endometrium, and ovaries. COMPARISON:  Prior CT from 12/03/2019. FINDINGS: Uterus Measurements: 4.9 x 4.4 x 4.1 cm = volume: 44.9 mL. Exophytic fibroid extending from the right anterior uterine fundus measures 3.2 x 2.7 x 3.6 cm, corresponding with mass lesion seen on prior CT. Endometrium Thickness: 4 mm.  No focal abnormality visualized. Right ovary Not visualized.  No adnexal mass. Left ovary Not visualized.  No adnexal mass. Other findings No abnormal free fluid. IMPRESSION: 1. 3.6 cm exophytic fibroid extending from the right uterine fundus, corresponding with abnormality on prior CT. 2. No other acute abnormality identified. 3. Nonvisualization of either ovary. No other pelvic or adnexal mass. Electronically Signed   By: Jeannine Boga M.D.   On: 12/04/2019 00:47   VAS [place] LOWER EXTREMITY VENOUS (DVT)  Result Date: 12/03/2019  Lower Venous DVTStudy Indications: Pain, and Swelling.  Risk Factors: None identified. Comparison Study: No prior studies. Performing Technologist: Oliver Hum RVT  Examination Guidelines: A complete evaluation includes B-mode imaging, spectral Doppler, color Doppler, and power Doppler as needed of all accessible portions of each vessel. Bilateral testing is considered an integral part of a complete examination. Limited examinations for reoccurring indications  may be performed as noted. The reflux portion of the exam is performed with the patient in reverse Trendelenburg.  +---------+---------------+---------+-----------+----------+--------------+ RIGHT    CompressibilityPhasicitySpontaneityPropertiesThrombus Aging +---------+---------------+---------+-----------+----------+--------------+ CFV      None           No       No                   Acute          +---------+---------------+---------+-----------+----------+--------------+ SFJ      None                                         Acute          +---------+---------------+---------+-----------+----------+--------------+ FV Prox  None           No       No                   Acute          +---------+---------------+---------+-----------+----------+--------------+ FV Mid   None           No       No                   Acute          +---------+---------------+---------+-----------+----------+--------------+ FV DistalNone           No       No                   Acute          +---------+---------------+---------+-----------+----------+--------------+ PFV      None                                         Acute          +---------+---------------+---------+-----------+----------+--------------+ POP      None           No       No                   Acute          +---------+---------------+---------+-----------+----------+--------------+  PTV      None                                         Acute          +---------+---------------+---------+-----------+----------+--------------+ PERO     None                                         Acute          +---------+---------------+---------+-----------+----------+--------------+ Gastroc  None                                         Acute          +---------+---------------+---------+-----------+----------+--------------+ EIV      None           No       No                   Acute           +---------+---------------+---------+-----------+----------+--------------+ CIV      Full                                                        +---------+---------------+---------+-----------+----------+--------------+   +----+---------------+---------+-----------+----------+--------------+ LEFTCompressibilityPhasicitySpontaneityPropertiesThrombus Aging +----+---------------+---------+-----------+----------+--------------+ CFV Full           Yes      Yes                                 +----+---------------+---------+-----------+----------+--------------+     Summary: RIGHT: - Findings consistent with acute deep vein thrombosis involving the right external iliac vein, common femoral vein, SF junction, right femoral vein, right proximal profunda vein, right popliteal vein, right posterior tibial veins, right peroneal veins, and right gastrocnemius veins. - No cystic structure found in the popliteal fossa.  LEFT: - No evidence of common femoral vein obstruction.  *See table(s) above for measurements and observations. Electronically signed by Harold Barban MD on 12/03/2019 at 8:41:53 PM.    Final        Subjective: Patient seen and examined at bedside.  Denies worsening leg swelling.  No overnight fever, nausea, vomiting or worsening shortness of breath.  Discharge Exam: Vitals:   12/05/19 0629 12/05/19 0840  BP: 127/65 101/76  Pulse: 99 (!) 103  Resp: 20 19  Temp: 98.4 F (36.9 C) 98.6 F (37 C)  SpO2: 94% 97%    General: Pt is alert, awake, not in acute distress Cardiovascular: Mild intermittent tachycardia, S1/S2 + Respiratory: bilateral decreased breath sounds at bases Abdominal: Soft, NT, ND, bowel sounds + Extremities: Trace lower extremity edema, no cyanosis    The results of significant diagnostics from this hospitalization (including imaging, microbiology, ancillary and laboratory) are listed below for reference.     Microbiology: No results found for this  or any previous visit (from the past 240 hour(s)).   Labs: BNP (last 3 results) No results for input(s): BNP in the last 8760 hours. Basic Metabolic  Panel: Recent Labs  Lab 12/03/19 1449 12/04/19 0240 12/05/19 0037  NA 137 137 139  K 3.6 4.2 3.5  CL 98 105 107  CO2 26 25 24   GLUCOSE 112* 113* 116*  BUN 27* 20 15  CREATININE 1.33* 1.07* 0.95  CALCIUM 9.9 9.1 8.9  MG  --  2.1 1.8   Liver Function Tests: Recent Labs  Lab 12/04/19 0240 12/05/19 0037  AST 17 20  ALT 16 14  ALKPHOS 88 80  BILITOT 0.9 1.1  PROT 6.0* 5.7*  ALBUMIN 2.7* 2.6*   No results for input(s): LIPASE, AMYLASE in the last 168 hours. No results for input(s): AMMONIA in the last 168 hours. CBC: Recent Labs  Lab 12/03/19 1449 12/04/19 0240 12/05/19 0037  WBC 9.7 8.4 9.4  NEUTROABS 6.9  --   --   HGB 12.8 11.2* 10.9*  HCT 39.3 34.3* 33.7*  MCV 97.0 96.3 96.6  PLT 411* 353 325   Cardiac Enzymes: No results for input(s): CKTOTAL, CKMB, CKMBINDEX, TROPONINI in the last 168 hours. BNP: Invalid input(s): POCBNP CBG: No results for input(s): GLUCAP in the last 168 hours. D-Dimer No results for input(s): DDIMER in the last 72 hours. Hgb A1c No results for input(s): HGBA1C in the last 72 hours. Lipid Profile No results for input(s): CHOL, HDL, LDLCALC, TRIG, CHOLHDL, LDLDIRECT in the last 72 hours. Thyroid function studies No results for input(s): TSH, T4TOTAL, T3FREE, THYROIDAB in the last 72 hours.  Invalid input(s): FREET3 Anemia work up No results for input(s): VITAMINB12, FOLATE, FERRITIN, TIBC, IRON, RETICCTPCT in the last 72 hours. Urinalysis No results found for: COLORURINE, APPEARANCEUR, LABSPEC, [place], GLUCOSEU, HGBUR, BILIRUBINUR, KETONESUR, PROTEINUR, UROBILINOGEN, NITRITE, LEUKOCYTESUR Sepsis Labs Invalid input(s): PROCALCITONIN,  WBC,  LACTICIDVEN Microbiology No results found for this or any previous visit (from the past 240 hour(s)).   Time coordinating discharge: 35  minutes  SIGNED:   Aline August, MD  [facility] 12/05/2019, 10:44 AM

## 2019-12-05 NOTE — Progress Notes (Signed)
[facility] for IV Heparin>> enoxaparin Indication:  PE/DVT 12/03/19  Allergies  Allergen Reactions  . Codeine     "makes me feel hyper"    Patient Measurements: Height: 5\' 4"  (162.6 cm) Weight: 66.5 kg (146 lb 11.2 oz) IBW/kg (Calculated) : 54.7 Heparin Dosing Weight: 67 kg  Vital Signs: Temp: 98.6 F (37 C) (05/07 0840) Temp Source: Oral (05/07 0840) BP: 101/76 (05/07 0840) Pulse Rate: 103 (05/07 0840)  Labs: Recent Labs    12/03/19 1449 12/03/19 1449 12/03/19 1819 12/03/19 1819 12/04/19 0240 12/04/19 0657 12/04/19 0657 12/04/19 2022 12/05/19 0037 12/05/19 0243  HGB 12.8   < >  --   --  11.2*  --   --   --  10.9*  --   HCT 39.3  --   --   --  34.3*  --   --   --  33.7*  --   PLT 411*  --   --   --  353  --   --   --  325  --   APTT  --   --  47*   < >  --  105*   < > >200* 21* 80*  HEPARINUNFRC  --   --  >2.20*  --   --  >2.20*  --   --  1.10*  --   CREATININE 1.33*  --   --   --  1.07*  --   --   --  0.95  --    < > = values in this interval not displayed.    Estimated Creatinine Clearance: 50.9 mL/min (by C-G formula based on SCr of 0.95 mg/dL).   Assessment: 74 yr old female with acute nonocclusive RLL PE and extensive nonocculsive RLE DVT on 12/03/19. Patient was given a dose of Xarelto 15 mg at 1400 on 5/5 at PCP office. Pharmacy was consulted for switching heparin to enoxaparin.   Pt underwent caval and RLE mechanical thrombectomy 5/6. Per IR MD, transition to weight-based Lovenox 6 wks then transition to [place]. Per RN, no issues with IV or bleeding observed post procedure. CM helping to check cost of enoxaparin.   Goal of Therapy: Monitor platelets by anticoagulation protocol: Yes  Plan: Stop heparin infusion Start enoxaparin 60mg  Q12 hr - asked RN to teach pt injection technique Monitor CBC/plt, signs/symptoms of bleeding    Benetta Spar, PharmD, BCPS, BCCP Clinical Pharmacist  Please check AMION for all [place] phone numbers After 10:00 PM, call [place]

## 2019-12-05 NOTE — Progress Notes (Signed)
Pt discharged home with husband. Pt left with all of her belongings. Pt discharged via wheelchair and was accompanied by a Wilfrid Lund.

## 2019-12-05 NOTE — Progress Notes (Signed)
Pt discharging home with husband. IV and telemetry box removed. Pt received discharge instructions and all questions were answered. Pt instructed on how to give lovenox shots. Pt demonstrated correct technique. Husband at bedside to receive education as well. Pt waiting for TOC to deliver home doses of lovenox.

## 2019-12-05 NOTE — TOC Benefit Eligibility Note (Signed)
Transition of Care [facility]) Benefit Eligibility Note    Patient Details  Name: Belinda Perry MRN: [id number] Date of Birth: 08-22-1945   Medication/Dose: ENOXAPARIN   60 MG Q12 HR  FOR  6 WEEKS  Covered?: Yes  Tier: 3 Drug  Prescription Coverage Preferred Pharmacy: [facility]  Spoke with Person/Company/Phone Number:: QUESHA  @ [employer] [id number] # [phone]  Co-Pay: $249.74  Prior Approval: No  Deductible: Unmet       Memory Argue Phone Number: 12/05/2019, 10:09 AM

## 2019-12-05 NOTE — Discharge Instructions (Signed)
Enoxaparin injection What is this medicine? ENOXAPARIN (ee nox a PA rin) is used after knee, hip, or abdominal surgeries to prevent blood clotting. It is also used to treat existing blood clots in the lungs or in the veins. This medicine may be used for other purposes; ask your health care provider or pharmacist if you have questions. COMMON BRAND NAME(S): Lovenox  What should I tell my health care provider before I take this medicine? They need to know if you have any of these conditions:  bleeding disorders, hemorrhage, or hemophilia  infection of the heart or heart valves  kidney or liver disease  previous stroke  prosthetic heart valve  recent surgery or delivery of a baby  ulcer in the stomach or intestine, diverticulitis, or other bowel disease  an unusual or allergic reaction to enoxaparin, heparin, pork or pork products, other medicines, foods, dyes, or preservatives  pregnant or trying to get pregnant  breast-feeding  How should I use this medicine? This medicine is for injection under the skin. It is usually given by a health-care professional. You or a family member may be trained on how to give the injections. If you are to give yourself injections, make sure you understand how to use the syringe, measure the dose if necessary, and give the injection. To avoid bruising, do not rub the site where this medicine has been injected. Do not take your medicine more often than directed. Do not stop taking except on the advice of your doctor or health care professional. Make sure you receive a puncture-resistant container to dispose of the needles and syringes once you have finished with them. Do not reuse these items. Return the container to your doctor or health care professional for proper disposal. Talk to your pediatrician regarding the use of this medicine in children. Special care may be needed. Overdosage: If you think you have taken too much of this medicine contact a  poison control center or emergency room at once. NOTE: This medicine is only for you. Do not share this medicine with others.  See this Video for how to administer Enoxaparin: [email or web address]   What if I miss a dose? If you miss a dose, take it as soon as you can. If it is almost time for your next dose, take only that dose. Do not take double or extra doses.  What may interact with this medicine?  aspirin and aspirin-like medicines  certain medicines that treat or prevent blood clots  dipyridamole  NSAIDs, medicines for pain and inflammation, like ibuprofen or naproxen This list may not describe all possible interactions. Give your health care provider a list of all the medicines, herbs, non-prescription drugs, or dietary supplements you use. Also tell them if you smoke, drink alcohol, or use illegal drugs. Some items may interact with your medicine.  What should I watch for while using this medicine? Visit your healthcare professional for regular checks on your progress. You may need blood work done while you are taking this medicine. Your condition will be monitored carefully while you are receiving this medicine. It is important not to miss any appointments. If you are going to need surgery or other procedure, tell your healthcare professional that you are using this medicine. Using this medicine for a long time may weaken your bones and increase the risk of bone fractures. Avoid sports and activities that might cause injury while you are using this medicine. Severe falls or injuries can cause unseen bleeding. Be careful when using  sharp tools or knives. Consider using an Copy. Take special care brushing or flossing your teeth. Report any injuries, bruising, or red spots on the skin to your healthcare professional. Wear a medical ID bracelet or chain. Carry a card that describes your disease and details of your medicine and dosage  times.  What side effects may I notice from receiving this medicine? Side effects that you should report to your doctor or health care professional as soon as possible:  allergic reactions like skin rash, itching or hives, swelling of the face, lips, or tongue  bone pain  signs and symptoms of bleeding such as bloody or black, tarry stools; red or dark-brown urine; spitting up blood or brown material that looks like coffee grounds; red spots on the skin; unusual bruising or bleeding from the eye, gums, or nose  signs and symptoms of a blood clot such as chest pain; shortness of breath; pain, swelling, or warmth in the leg  signs and symptoms of a stroke such as changes in vision; confusion; trouble speaking or understanding; severe headaches; sudden numbness or weakness of the face, arm or leg; trouble walking; dizziness; loss of coordination Side effects that usually do not require medical attention (report to your doctor or health care professional if they continue or are bothersome):  hair loss  pain, redness, or irritation at site where injected This list may not describe all possible side effects. Call your doctor for medical advice about side effects. You may report side effects to FDA at 1-800-FDA-1088.  Where should I keep my medicine? Keep out of the reach of children. Store at room temperature between 15 and 30 degrees C (59 and 86 degrees F). Do not freeze. If your injections have been specially prepared, you may need to store them in the refrigerator. Ask your pharmacist. Throw away any unused medicine after the expiration date.   NOTE: This sheet is a summary. It may not cover all possible information. If you have questions about this medicine, talk to your doctor, pharmacist, or health care provider.  2020 Elsevier/Gold Standard (2017-07-12 11:25:34)   =================================================== Anticoagulant Injection Instructions Using a Prefilled Syringe     Injectable blood thinners (anticoagulants) are medicines that help prevent blood clots from developing. Two common injectable anticoagulant medicines that are used are fondaparinux and enoxaparin. Injectable anticoagulant medicines are given with a single-use syringe that already has medicine inside of it (prefilled syringe). You inject the medicine through a needle into the layer of fat and tissue between skin and muscle (subcutaneous) in your belly. Before your first injection, your health care provider will instruct you on how to take your anticoagulant medicine at home. Also read the medication guide or package insert that came with the prefilled syringe. Follow directions from the guide about how to prepare and give the injection.  What are the risks? Generally, self-injection of anticoagulant medicine is safe. However, mild problems can occur, including mild bleeding, itching, or rash at the injection site. Other risks may include:  Bleeding and bruising.  A low platelet count (thrombocytopenia).  An allergic reaction to the medication.  Liver damage.  A low red blood cell count (anemia).  Supplies needed: To inject the medicine, you will need:  Alcohol wipes.  A prefilled syringe with needle.  A container for syringe disposal. This may be a puncture-proof sharps container or a hard-sided plastic container with a cover, such as an empty laundry detergent bottle.  How to inject anticoagulant medicine 1. Wash  your hands with soap and water. If soap and water are not available, use alcohol-based hand sanitizer. 2. Locate the site on your belly where the medicine should be injected. Avoid the area within 2 inches (5 cm) of your navel (umbilicus). 3. Use an alcohol wipe to clean the site where you will be injecting the needle. Let the site air-dry. 4. Remove the plastic cover from the needle on the syringe. Do not let the needle touch anything. 5. Hold the syringe with one hand and  use your other hand to twist the rigid needle guard (covering the needle) counterclockwise. Pull the needle guard straight off the needle. Discard the needle guard. 6. When using a prefilled syringe, do not push the air bubble out of the syringe before the injection. The air bubble will help you get all of the medicine out of the syringe and into your subcutaneous tissue. 7. Hold the syringe in your writing hand like a pencil. 8. Use your other hand to pinch and hold about an inch (2.5 cm) of skin. Do not directly touch the cleaned part of the skin. 9. Insert the entire needle straight into the fold of skin. The needle should be at a 90-degree angle (perpendicular) to the skin. Push the needle all the way against the skin. 10. After the needle is completely inserted into the skin, release the skin that you are pinching. Continue to hold the syringe with your writing hand. 11. Use the thumb or index finger of your writing hand to push the plunger all the way into the syringe to inject the medicine. 12. Pull the needle straight out of the skin. 13. Press and hold an alcohol wipe over the injection site until the bleeding stops. Do not rub the area. 14. Cover the injection site with a bandage, if needed. 15. Do not recap the needle. 16. If your syringe has a safety system for shielding the needle after injection: ? Firmly push down on the plunger after you complete the injection. The protective sleeve will automatically cover the needle, and you will hear a click. The click means that the needle is safely covered. 17. Place the syringe and needle in the disposal container.  What else do I need to know? 1. Do not use the syringe or needle more than one time. 2. Change the injection site each time you give yourself a shot. 3. Before an injection, make sure the medicine is a clear and colorless or pale yellow. Do not use your medicine if it is discolored or has particles in it. Let your health care  provider know right away. 4. Tell your health care providers, including dentists: ? That you are taking an anticoagulant, especially if you are injured or plan to have a procedure. ? If there is a change in your illness, and any other changes in medicines, supplements, or diet. 5. Take over-the-counter and prescription medicines only as told by your health care provider. 6. Keep your medicine safely stored at room temperature. 7. Keep all follow-up visits as told by your health care provider.  Contact a health care provider if:  You develop any rashes on your skin.  You have large areas of bruising on your skin.  Your condition worsens.  You develop a fever.  There is blood in your urine.  You are bleeding from your gums.  Get help right away if: 1. You develop bleeding problems such as: ? Vomiting blood or coughing up blood. ? Dark red blood  in your urine. ? Blood in your stool or your stool has a dark, tarry, or coffee-ground appearance. 2. You have bleeding that does not stop. 3. You develop chest pain or shortness of breath. 4. You develop a severe headache or confusion.  These symptoms may represent a serious problem that is an emergency. Do not wait to see if the symptoms will go away. Get medical help right away. Call your local emergency services (911 in the [place] Do not drive yourself to the hospital. Summary  Injectable blood thinners (anticoagulants) are medicines that help prevent blood clots from developing in the veins.  You inject the medicine through a needle into the layer of fat and tissue between skin and muscle (subcutaneous) in your belly.  Keep all follow-up visits as told by your health care provider. Follow-up visits include visits for lab tests. This information is not intended to replace advice given to you by your health care provider. Make sure you discuss any questions you have with your health care provider. Document Revised: 08/25/2017 Document  Reviewed: 08/25/2017 Elsevier Patient Education  [place].   ===========================================  Pulmonary Embolism    A pulmonary embolism (PE) is a sudden blockage or decrease of blood flow in one or both lungs. Most blockages come from a blood clot that forms in the vein of a lower leg, thigh, or arm (deep vein thrombosis, DVT) and travels to the lungs. A clot is blood that has thickened into a gel or solid. PE is a dangerous and life-threatening condition that needs to be treated right away.  What are the causes? This condition is usually caused by a blood clot that forms in a vein and moves to the lungs. In rare cases, it may be caused by air, fat, part of a tumor, or other tissue that moves through the veins and into the lungs.  What increases the risk? The following factors may make you more likely to develop this condition: 18. Experiencing a traumatic injury, such as breaking a hip or leg. 19. Having: ? A spinal cord injury. ? Orthopedic surgery, especially hip or knee replacement. ? Any major surgery. ? A stroke. ? DVT. ? Blood clots or blood clotting disease. ? Long-term (chronic) lung or heart disease. ? Cancer treated with chemotherapy. ? A central venous catheter. 20. Taking medicines that contain estrogen. These include birth control pills and hormone replacement therapy. 77. Being: ? Pregnant. ? In the period of time after your baby is delivered (postpartum). ? Older than age 48. ? Overweight. ? A smoker, especially if you have other risks.  What are the signs or symptoms? Symptoms of this condition usually start suddenly and include:  Shortness of breath during activity or at rest.  Coughing, coughing up blood, or coughing up blood-tinged mucus.  Chest pain that is often worse with deep breaths.  Rapid or irregular heartbeat.  Feeling light-headed or dizzy.  Fainting.  Feeling anxious.  Fever.  Sweating.  Pain and swelling in  a leg. This is a symptom of DVT, which can lead to PE. How is this diagnosed? This condition may be diagnosed based on:  Your medical history.  A physical exam.  Blood tests.  CT pulmonary angiogram. This test checks blood flow in and around your lungs.  Ventilation-perfusion scan, also called a lung VQ scan. This test measures air flow and blood flow to the lungs.  An ultrasound of the legs.  How is this treated? Treatment for this condition depends  on many factors, such as the cause of your PE, your risk for bleeding or developing more clots, and other medical conditions you have. Treatment aims to remove, dissolve, or stop blood clots from forming or growing larger. Treatment may include: 8. Medicines, such as: ? Blood thinning medicines (anticoagulants) to stop clots from forming. ? Medicines that dissolve clots (thrombolytics). 9. Procedures, such as: ? Using a flexible tube to remove a blood clot (embolectomy) or to deliver medicine to destroy it (catheter-directed thrombolysis). ? Inserting a filter into a large vein that carries blood to the heart (inferior vena cava). This filter (vena cava filter) catches blood clots before they reach the lungs. ? Surgery to remove the clot (surgical embolectomy). This is rare. You may need a combination of immediate, long-term (up to 3 months after diagnosis), and extended (more than 3 months after diagnosis) treatments. Your treatment may continue for several months (maintenance therapy). You and your health care provider will work together to choose the treatment program that is best for you.  Follow these instructions at home: Medicines 5. Take over-the-counter and prescription medicines only as told by your health care provider. 6. If you are taking an anticoagulant medicine: ? Take the medicine every day at the same time each day. ? Understand what foods and drugs interact with your medicine. ? Understand the side effects of this  medicine, including excessive bruising or bleeding. Ask your health care provider or pharmacist about other side effects.  General instructions  Wear a medical alert bracelet or carry a medical alert card that says you have had a PE and lists what medicines you take.  Ask your health care provider when you may return to your normal activities. Avoid sitting or lying for a long time without moving.  Maintain a healthy weight. Ask your health care provider what weight is healthy for you.  Do not use any products that contain nicotine or tobacco, such as cigarettes, e-cigarettes, and chewing tobacco. If you need help quitting, ask your health care provider.  Talk with your health care provider about any travel plans. It is important to make sure that you are still able to take your medicine while on trips.  Keep all follow-up visits as told by your health care provider. This is important.  Contact a health care provider if:  You missed a dose of your blood thinner medicine.  Get help right away if: 1. You have: ? New or increased pain, swelling, warmth, or redness in an arm or leg. ? Numbness or tingling in an arm or leg. ? Shortness of breath during activity or at rest. ? A fever. ? Chest pain. ? A rapid or irregular heartbeat. ? A severe headache. ? Vision changes. ? A serious fall or accident, or you hit your head. ? Stomach (abdominal) pain. ? Blood in your vomit, stool, or urine. ? A cut that will not stop bleeding. 2. You cough up blood. 3. You feel light-headed or dizzy. 4. You cannot move your arms or legs. 5. You are confused or have memory loss.  These symptoms may represent a serious problem that is an emergency. Do not wait to see if the symptoms will go away. Get medical help right away. Call your local emergency services (911 in the [place] Do not drive yourself to the hospital. Summary  A pulmonary embolism (PE) is a sudden blockage or decrease of blood flow in  one or both lungs. PE is a dangerous and life-threatening condition  that needs to be treated right away.  Treatments for this condition usually include medicines to thin your blood (anticoagulants) or medicines to break apart blood clots (thrombolytics).  If you are given blood thinners, it is important to take the medicine every day at the same time each day.  Understand what foods and drugs interact with any medicines that you are taking.  If you have signs of PE or DVT, call your local emergency services (911 in the [place] This information is not intended to replace advice given to you by your health care provider. Make sure you discuss any questions you have with your health care provider. Document Revised: 04/24/2018 Document Reviewed: 04/24/2018 Elsevier Patient Education  [place].   ===================================================== Deep Vein Thrombosis    Deep vein thrombosis (DVT) is a condition in which a blood clot forms in a deep vein, such as a lower leg, thigh, or arm vein. A clot is blood that has thickened into a gel or solid. This condition is dangerous. It can lead to serious and even life-threatening complications if the clot travels to the lungs and causes a blockage (pulmonary embolism). It can also damage veins in the leg. This can result in leg pain, swelling, discoloration, and sores (post-thrombotic syndrome).  What are the causes? This condition may be caused by:  A slowdown of blood flow.  Damage to a vein.  A condition that causes blood to clot more easily, such as an inherited clotting disorder.   What increases the risk? The following factors may make you more likely to develop this condition: 22. Being overweight. 4. Being older, especially over age 97. 24. Sitting or lying down for more than four hours. 78. Being in the hospital. 26. Lack of physical activity (sedentary lifestyle). 80. Pregnancy, being in childbirth, or having  recently given birth. 28. Taking medicines that contain estrogen, such as medicines to prevent pregnancy. 29. Smoking. 30. A history of any of the following: ? Blood clots or a blood clotting disease. ? Peripheral vascular disease. ? Inflammatory bowel disease. ? Cancer. ? Heart disease. ? Genetic conditions that affect how your blood clots, such as Factor V Leiden mutation. ? Neurological diseases that affect your legs (leg paresis). ? A recent injury, such as a car accident. ? Major or lengthy surgery. ? A central line placed inside a large vein.  What are the signs or symptoms? Symptoms of this condition include:  Swelling, pain, or tenderness in an arm or leg.  Warmth, redness, or discoloration in an arm or leg. If the clot is in your leg, symptoms may be more noticeable or worse when you stand or walk. Some people may not develop any symptoms.  How is this diagnosed? This condition is diagnosed with: 10. A medical history and physical exam. 11. Tests, such as: ? Blood tests. These are done to check how well your blood clots. ? Ultrasound. This is done to check for clots. ? Venogram. For this test, contrast dye is injected into a vein and X-rays are taken to check for any clots  How is this treated? Treatment for this condition depends on:  The cause of your DVT.  Your risk for bleeding or developing more clots.  Any other medical conditions that you have. Treatment may include: 7. Taking a blood thinner (anticoagulant). This type of medicine prevents clots from forming. It may be taken by mouth, injected under the skin, or injected through an IV (catheter). 8. Injecting clot-dissolving medicines into  the affected vein (catheter-directed thrombolysis). 9. Having surgery. Surgery may be done to: ? Remove the clot. ? Place a filter in a large vein to catch blood clots before they reach the lungs. Some treatments may be continued for up to six months.  Follow these  instructions at home: If you are taking blood thinners: 6. Take the medicine exactly as told by your health care provider. Some blood thinners need to be taken at the same time every day. Do not skip a dose. 7. Talk with your health care provider before you take any medicines that contain aspirin or NSAIDs. These medicines increase your risk for dangerous bleeding. 8. Ask your health care provider about foods and drugs that could change the way the medicine works (may interact). Avoid those things if your health care provider tells you to do so. 9. Blood thinners can cause easy bruising and may make it difficult to stop bleeding. Because of this: ? Be very careful when using knives, scissors, or other sharp objects. ? Use an electric razor instead of a blade. ? Avoid activities that could cause injury or bruising, and follow instructions about how to prevent falls. 10. Wear a medical alert bracelet or carry a card that lists what medicines you take.  General instructions  Take over-the-counter and prescription medicines only as told by your health care provider.  Return to your normal activities as told by your health care provider. Ask your health care provider what activities are safe for you.  Wear compression stockings if recommended by your health care provider.  Keep all follow-up visits as told by your health care provider. This is important.  How is this prevented? To lower your risk of developing this condition again: 1. For 30 or more minutes every day, do an activity that: ? Involves moving your arms and legs. ? Increases your heart rate. 2. When traveling for longer than four hours: ? Exercise your arms and legs every hour. ? Drink plenty of water. ? Avoid drinking alcohol. 3. Avoid sitting or lying for a long time without moving your legs. 4. If you have surgery or you are hospitalized, ask about ways to prevent blood clots. These may include taking frequent walks or using  anticoagulants. 5. Stay at a healthy weight. 6. If you are a woman who is older than age 72, avoid unnecessary use of medicines that contain estrogen, such as some birth control pills. 7. Do not use any products that contain nicotine or tobacco, such as cigarettes and e-cigarettes. This is especially important if you take estrogen medicines. If you need help quitting, ask your health care provider.  Contact a health care provider if:  You miss a dose of your blood thinner.  Your menstrual period is heavier than usual.  You have unusual bruising.  Get help right away if: 1. You have: ? New or increased pain, swelling, or redness in an arm or leg. ? Numbness or tingling in an arm or leg. ? Shortness of breath. ? Chest pain. ? A rapid or irregular heartbeat. ? A severe headache or confusion. ? A cut that will not stop bleeding. 2. There is blood in your vomit, stool, or urine. 3. You have a serious fall or accident, or you hit your head. 4. You feel light-headed or dizzy. 5. You cough up blood.  These symptoms may represent a serious problem that is an emergency. Do not wait to see if the symptoms will go away. Get medical help  right away. Call your local emergency services (911 in the [place] Do not drive yourself to the hospital. Summary  Deep vein thrombosis (DVT) is a condition in which a blood clot forms in a deep vein, such as a lower leg, thigh, or arm vein.  Symptoms can include swelling, warmth, pain, and redness in your leg or arm.  This condition may be treated with a blood thinner (anticoagulant medicine), medicine that is injected to dissolve blood clots,compression stockings, or surgery.  If you are prescribed blood thinners, take them exactly as told. This information is not intended to replace advice given to you by your health care provider. Make sure you discuss any questions you have with your health care provider. Document Revised: 06/29/2017 Document Reviewed:  12/15/2016 Elsevier Patient Education  [place].

## 2019-12-05 NOTE — Progress Notes (Signed)
Referring Physician(s): Starla Link, Kshitiz  Supervising Physician: Jacqulynn Cadet  Patient Status:  [facility] - In-pt  Chief Complaint: Follow up RLE mechanical thrombectomy 5/6 with Dr. Laurence Ferrari  Subjective:  Ms. 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 She is not excited about having to give herself shots at home but understands why this has been recommended. She denies any pain in her legs but does describe a little bit of heaviness still that makes her feel like she "gliding" which is similar to how she felt prior to the procedure. She has not had any abnormal bleeding, chest pain or dyspnea. She is very pleased with the care she has received during this hospitalization.  Per patient she has recently received 2 doses of the Pfizer vaccine.  Allergies: Codeine  Medications: Prior to Admission medications   Medication Sig Start Date End Date Taking? Authorizing Provider  BIOTIN PO Take 1 tablet by mouth.   Yes [provider]  cetirizine (ZYRTEC) 10 MG tablet Take 10 mg by mouth daily as needed for allergies.   Yes [provider]  fluticasone (FLONASE) 50 MCG/ACT nasal spray Place 2 sprays into both nostrils daily as needed for allergies or rhinitis.   Yes [provider]  Multiple Vitamins-Minerals (MULTIVITAL) CHEW Chew 1 tablet by mouth daily.   Yes [provider]  Probiotic Product (PROBIOTIC DAILY PO) Take 1 tablet by mouth.   Yes [provider]  simvastatin (ZOCOR) 40 MG tablet TAKE 1 TABLET (40 MG TOTAL) BY MOUTH AT BEDTIME. Patient taking differently: Take 40 mg by mouth at bedtime.  11/30/16  Yes Venia Carbon, MD  potassium chloride (KLOR-CON) 10 MEQ tablet Take 10 mEq by mouth daily. 12/02/19   [provider]     Vital Signs: BP 101/76 (BP Location: Left Arm)   Pulse (!) 103   Temp 98.6 F (37 C) (Oral)   Resp 19   Ht 5\' 4"  (1.626 m)   Wt  146 lb 11.2 oz (66.5 kg)   SpO2 97%   BMI 25.18 kg/m   Physical Exam Vitals and nursing note reviewed.  HENT:     Head: Normocephalic.  Cardiovascular:     Rate and Rhythm: Tachycardia present.  Pulmonary:     Effort: Pulmonary effort is normal.     Breath sounds: Normal breath sounds.  Musculoskeletal:     Right lower leg: Edema (1-2+ non pitting) present.     Left lower leg: No edema.     Comments: Bilateral popliteal puncture sites clean, dry, dressed appropriately. No swelling, erythema or significant edema. Non tender to touch. Purse string sutures removed today without complication.  Skin:    General: Skin is warm and dry.  Neurological:     Mental Status: She is alert. Mental status is at baseline.  Psychiatric:        Mood and Affect: Mood normal.        Behavior: Behavior normal.        Thought Content: Thought content normal.        Judgment: Judgment normal.     Imaging: CT Angio Chest PE W and/or Wo Contrast  Result Date: 12/03/2019 CLINICAL DATA:  Shortness of breath and decreased appetite. Known lower extremity DVT. EXAM: CT ANGIOGRAPHY CHEST CT ABDOMEN AND PELVIS WITH CONTRAST TECHNIQUE: Multidetector CT imaging of the chest was performed using the standard protocol during bolus administration of  intravenous contrast. Multiplanar CT image reconstructions and MIPs were obtained to evaluate the vascular anatomy. Multidetector CT imaging of the abdomen and pelvis was performed using the standard protocol during bolus administration of intravenous contrast. CONTRAST:  41mL OMNIPAQUE IOHEXOL 350 MG/ML SOLN COMPARISON:  None. FINDINGS: CTA CHEST FINDINGS Cardiovascular: Contrast injection is sufficient to demonstrate satisfactory opacification of the pulmonary arteries to the segmental level.There is an acute appearing segmental pulmonary embolus involving the right lower lobe (axial series 1, image 86). The main pulmonary artery is within normal limits for size. There is no  CT evidence of acute right heart strain. There are atherosclerotic changes of the thoracic aorta without evidence for an aneurysm or dissection. Heart size is normal, without pericardial effusion. Mediastinum/Nodes: --No mediastinal or hilar lymphadenopathy. --No axillary lymphadenopathy. --No supraclavicular lymphadenopathy. --the left thyroid gland is enlarged with a dominant left-sided thyroid nodule that appears to be a measuring at least 3.2 cm. --The esophagus is unremarkable Lungs/Pleura: There are mild emphysematous changes bilaterally. There is no pneumothorax. No significant pleural effusion. The trachea is unremarkable. Musculoskeletal: No chest wall abnormality. No acute or significant osseous findings. Appears to be at least 1 left-sided breast nodule measuring approximately 1.2 cm (axial series 4, image 138). This was likely previously evaluated on the patient's recent screening mammogram and as such no further follow-up is required. Review of the MIP images confirms the above findings. CT ABDOMEN and PELVIS FINDINGS Hepatobiliary: The liver is normal. Normal gallbladder.There is no biliary ductal dilation. Pancreas: Normal contours without ductal dilatation. No peripancreatic fluid collection. Spleen: Unremarkable. Adrenals/Urinary Tract: --Adrenal glands: Unremarkable. --Right kidney/ureter: No hydronephrosis or radiopaque kidney stones. --Left kidney/ureter: Multiple cortical cysts are noted. There is no hydronephrosis. --Urinary bladder: Unremarkable. Stomach/Bowel: --Stomach/Duodenum: No hiatal hernia or other gastric abnormality. Normal duodenal course and caliber. --Small bowel: Unremarkable. --Colon: Rectosigmoid diverticulosis without acute inflammation. --Appendix: Normal. Vascular/Lymphatic: Atherosclerotic calcification is present within the non-aneurysmal abdominal aorta, without hemodynamically significant stenosis. There is extensive nonocclusive thrombus from the right common femoral  vein extending into the right external and common iliac veins into the IVC. The thrombus terminates at the level of the left renal vein. --No retroperitoneal lymphadenopathy. --No mesenteric lymphadenopathy. --No pelvic or inguinal lymphadenopathy. Reproductive: There is an indeterminate mass that appears to be centered within the uterine fundus measuring approximately 3.4 x 3.6 cm. Other: No ascites or free air. The abdominal wall is normal. Musculoskeletal. No acute displaced fractures. 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 There is a chronic appearing compression fracture of the T12 vertebral body. Review of the MIP images confirms the above findings. IMPRESSION: 1. There is an acute nonocclusive segmental pulmonary embolus involving the right lower lobe. There is no CT evidence for heart strain. 2. Extensive nonocclusive thrombus is noted involving the right common femoral vein with extension into the right external and common iliac veins. There is extensive nonocclusive thrombus involving the IVC to the level of the left renal vein. 3. Indeterminate mass centered within the uterine fundus measuring approximately 3.6 cm. Follow-up with a nonemergent pelvic ultrasound is recommended for further evaluation of this finding. 4. Large left-sided heterogeneous thyroid nodule measuring at least 3.2 cm. A nonemergent outpatient thyroid ultrasound is recommended for further evaluation of this finding.(Ref: J Am Coll Radiol. 2015 Feb;12(2): 143-50). Aortic Atherosclerosis (ICD10-I70.0) and Emphysema (ICD10-J43.9). These results were called by telephone at the time of interpretation on 12/03/2019 at 5:06 pm to provider [facility] , who verbally acknowledged these  results. Electronically Signed   By: Constance Holster M.D.   On: 12/03/2019 17:19   CT ABDOMEN PELVIS W CONTRAST  Result Date: 12/03/2019 CLINICAL DATA:  Shortness of breath and decreased appetite. Known lower extremity DVT. EXAM: CT  ANGIOGRAPHY CHEST CT ABDOMEN AND PELVIS WITH CONTRAST TECHNIQUE: Multidetector CT imaging of the chest was performed using the standard protocol during bolus administration of intravenous contrast. Multiplanar CT image reconstructions and MIPs were obtained to evaluate the vascular anatomy. Multidetector CT imaging of the abdomen and pelvis was performed using the standard protocol during bolus administration of intravenous contrast. CONTRAST:  5mL OMNIPAQUE IOHEXOL 350 MG/ML SOLN COMPARISON:  None. FINDINGS: CTA CHEST FINDINGS Cardiovascular: Contrast injection is sufficient to demonstrate satisfactory opacification of the pulmonary arteries to the segmental level.There is an acute appearing segmental pulmonary embolus involving the right lower lobe (axial series 1, image 86). The main pulmonary artery is within normal limits for size. There is no CT evidence of acute right heart strain. There are atherosclerotic changes of the thoracic aorta without evidence for an aneurysm or dissection. Heart size is normal, without pericardial effusion. Mediastinum/Nodes: --No mediastinal or hilar lymphadenopathy. --No axillary lymphadenopathy. --No supraclavicular lymphadenopathy. --the left thyroid gland is enlarged with a dominant left-sided thyroid nodule that appears to be a measuring at least 3.2 cm. --The esophagus is unremarkable Lungs/Pleura: There are mild emphysematous changes bilaterally. There is no pneumothorax. No significant pleural effusion. The trachea is unremarkable. Musculoskeletal: No chest wall abnormality. No acute or significant osseous findings. Appears to be at least 1 left-sided breast nodule measuring approximately 1.2 cm (axial series 4, image 138). This was likely previously evaluated on the patient's recent screening mammogram and as such no further follow-up is required. Review of the MIP images confirms the above findings. CT ABDOMEN and PELVIS FINDINGS Hepatobiliary: The liver is normal.  Normal gallbladder.There is no biliary ductal dilation. Pancreas: Normal contours without ductal dilatation. No peripancreatic fluid collection. Spleen: Unremarkable. Adrenals/Urinary Tract: --Adrenal glands: Unremarkable. --Right kidney/ureter: No hydronephrosis or radiopaque kidney stones. --Left kidney/ureter: Multiple cortical cysts are noted. There is no hydronephrosis. --Urinary bladder: Unremarkable. Stomach/Bowel: --Stomach/Duodenum: No hiatal hernia or other gastric abnormality. Normal duodenal course and caliber. --Small bowel: Unremarkable. --Colon: Rectosigmoid diverticulosis without acute inflammation. --Appendix: Normal. Vascular/Lymphatic: Atherosclerotic calcification is present within the non-aneurysmal abdominal aorta, without hemodynamically significant stenosis. There is extensive nonocclusive thrombus from the right common femoral vein extending into the right external and common iliac veins into the IVC. The thrombus terminates at the level of the left renal vein. --No retroperitoneal lymphadenopathy. --No mesenteric lymphadenopathy. --No pelvic or inguinal lymphadenopathy. Reproductive: There is an indeterminate mass that appears to be centered within the uterine fundus measuring approximately 3.4 x 3.6 cm. Other: No ascites or free air. The abdominal wall is normal. Musculoskeletal. No acute displaced fractures. 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 There is a chronic appearing compression fracture of the T12 vertebral body. Review of the MIP images confirms the above findings. IMPRESSION: 1. There is an acute nonocclusive segmental pulmonary embolus involving the right lower lobe. There is no CT evidence for heart strain. 2. Extensive nonocclusive thrombus is noted involving the right common femoral vein with extension into the right external and common iliac veins. There is extensive nonocclusive thrombus involving the IVC to the level of the left renal vein. 3. Indeterminate  mass centered within the uterine fundus measuring approximately 3.6 cm. Follow-up with a nonemergent pelvic ultrasound is recommended for  further evaluation of this finding. 4. Large left-sided heterogeneous thyroid nodule measuring at least 3.2 cm. A nonemergent outpatient thyroid ultrasound is recommended for further evaluation of this finding.(Ref: J Am Coll Radiol. 2015 Feb;12(2): 143-50). Aortic Atherosclerosis (ICD10-I70.0) and Emphysema (ICD10-J43.9). These results were called by telephone at the time of interpretation on 12/03/2019 at 5:06 pm to provider [facility] , who verbally acknowledged these results. Electronically Signed   By: Constance Holster M.D.   On: 12/03/2019 17:19   US PELVIC COMPLETE WITH TRANSVAGINAL  Result Date: 12/04/2019 CLINICAL DATA:  Initial evaluation for uterine mass seen on prior CT. EXAM: TRANSABDOMINAL AND TRANSVAGINAL ULTRASOUND OF PELVIS TECHNIQUE: Both transabdominal and transvaginal ultrasound examinations of the pelvis were performed. Transabdominal technique was performed for global imaging of the pelvis including uterus, ovaries, adnexal regions, and pelvic cul-de-sac. It was necessary to proceed with endovaginal exam following the transabdominal exam to visualize the uterus, endometrium, and ovaries. COMPARISON:  Prior CT from 12/03/2019. FINDINGS: Uterus Measurements: 4.9 x 4.4 x 4.1 cm = volume: 44.9 mL. Exophytic fibroid extending from the right anterior uterine fundus measures 3.2 x 2.7 x 3.6 cm, corresponding with mass lesion seen on prior CT. Endometrium Thickness: 4 mm.  No focal abnormality visualized. Right ovary Not visualized.  No adnexal mass. Left ovary Not visualized.  No adnexal mass. Other findings No abnormal free fluid. IMPRESSION: 1. 3.6 cm exophytic fibroid extending from the right uterine fundus, corresponding with abnormality on prior CT. 2. No other acute abnormality identified. 3. Nonvisualization of either ovary. No other pelvic or adnexal  mass. Electronically Signed   By: Jeannine Boga M.D.   On: 12/04/2019 00:47   VAS [place] LOWER EXTREMITY VENOUS (DVT)  Result Date: 12/03/2019  Lower Venous DVTStudy Indications: Pain, and Swelling.  Risk Factors: None identified. Comparison Study: No prior studies. Performing Technologist: Oliver Hum RVT  Examination Guidelines: A complete evaluation includes B-mode imaging, spectral Doppler, color Doppler, and power Doppler as needed of all accessible portions of each vessel. Bilateral testing is considered an integral part of a complete examination. Limited examinations for reoccurring indications may be performed as noted. The reflux portion of the exam is performed with the patient in reverse Trendelenburg.  +---------+---------------+---------+-----------+----------+--------------+ RIGHT    CompressibilityPhasicitySpontaneityPropertiesThrombus Aging +---------+---------------+---------+-----------+----------+--------------+ CFV      None           No       No                   Acute          +---------+---------------+---------+-----------+----------+--------------+ SFJ      None                                         Acute          +---------+---------------+---------+-----------+----------+--------------+ FV Prox  None           No       No                   Acute          +---------+---------------+---------+-----------+----------+--------------+ FV Mid   None           No       No                   Acute          +---------+---------------+---------+-----------+----------+--------------+  FV DistalNone           No       No                   Acute          +---------+---------------+---------+-----------+----------+--------------+ PFV      None                                         Acute          +---------+---------------+---------+-----------+----------+--------------+ POP      None           No       No                   Acute           +---------+---------------+---------+-----------+----------+--------------+ PTV      None                                         Acute          +---------+---------------+---------+-----------+----------+--------------+ PERO     None                                         Acute          +---------+---------------+---------+-----------+----------+--------------+ Gastroc  None                                         Acute          +---------+---------------+---------+-----------+----------+--------------+ EIV      None           No       No                   Acute          +---------+---------------+---------+-----------+----------+--------------+ CIV      Full                                                        +---------+---------------+---------+-----------+----------+--------------+   +----+---------------+---------+-----------+----------+--------------+ LEFTCompressibilityPhasicitySpontaneityPropertiesThrombus Aging +----+---------------+---------+-----------+----------+--------------+ CFV Full           Yes      Yes                                 +----+---------------+---------+-----------+----------+--------------+     Summary: RIGHT: - Findings consistent with acute deep vein thrombosis involving the right external iliac vein, common femoral vein, SF junction, right femoral vein, right proximal profunda vein, right popliteal vein, right posterior tibial veins, right peroneal veins, and right gastrocnemius veins. - No cystic structure found in the popliteal fossa.  LEFT: - No evidence of common femoral vein obstruction.  *See table(s) above for measurements and observations. Electronically signed by Harold Barban MD on 12/03/2019 at 8:41:53 PM.    Final     Labs:  CBC: Recent Labs    12/03/19 1449 12/04/19 0240 12/05/19 0037  WBC 9.7 8.4 9.4  HGB 12.8 11.2* 10.9*  HCT 39.3 34.3* 33.7*  PLT 411* 353 325    COAGS: Recent Labs     12/04/19 0657 12/04/19 2022 12/05/19 0037 12/05/19 0243  APTT 105* >200* 21* 80*    BMP: Recent Labs    12/03/19 1449 12/04/19 0240 12/05/19 0037  NA 137 137 139  K 3.6 4.2 3.5  CL 98 105 107  CO2 26 25 24   GLUCOSE 112* 113* 116*  BUN 27* 20 15  CALCIUM 9.9 9.1 8.9  CREATININE 1.33* 1.07* 0.95  GFRNONAA 40* 52* >60  GFRAA 46* >60 >60    LIVER FUNCTION TESTS: Recent Labs    12/04/19 0240 12/05/19 0037  BILITOT 0.9 1.1  AST 17 20  ALT 16 14  ALKPHOS 88 80  PROT 6.0* 5.7*  ALBUMIN 2.7* 2.6*    Assessment and Plan:  74 y/o F with extensive RLE DVT of unknown etiology at this time who underwent mechanical thrombectomy yesterday in IR with Dr. Laurence Ferrari seen today for post procedure follow up.  Patient reports she is feeling very good and is anxious to go home, no concerning s/s reported by patient or staff. Bilateral popliteal puncture sites unremarkable on exam today - both sutures were removed by this writer today at bedside without complication, new dressings applied. Dressing changes PRN until healed at home, no activity/bathing restrictions required.  Ok for discharge from IR perspective - will defer timing to primary team. She will begin weight based Lovenox and continue x 6 weeks until she is seen in our clinic for follow up which includes repeat US. She is aware and agreeable to this plan.   Please call IR with any questions or concerns.   Electronically Signed: Joaquim Nam, PA-C 12/05/2019, 9:22 AM   I spent a total of 25 Minutes at the the patient's bedside AND on the patient's hospital floor or unit, greater than 50% of which was counseling/coordinating care for mechanical thrombectomy/suture removal.

## 2019-12-05 NOTE — Progress Notes (Signed)
[facility] for IV Heparin Indication:  PE/DVT 12/03/19  Allergies  Allergen Reactions  . Codeine     "makes me feel hyper"    Patient Measurements: Height: 5\' 4"  (162.6 cm) Weight: 67 kg (147 lb 9.6 oz) IBW/kg (Calculated) : 54.7 Heparin Dosing Weight: 67 kg  Vital Signs: Temp: 99 F (37.2 C) (05/06 2353) Temp Source: Oral (05/06 2353) BP: 101/54 (05/06 2353) Pulse Rate: 90 (05/06 2353)  Labs: Recent Labs    12/03/19 1449 12/03/19 1449 12/03/19 1819 12/03/19 1819 12/04/19 0240 12/04/19 0657 12/04/19 0657 12/04/19 2022 12/05/19 0037 12/05/19 0243  HGB 12.8   < >  --   --  11.2*  --   --   --  10.9*  --   HCT 39.3  --   --   --  34.3*  --   --   --  33.7*  --   PLT 411*  --   --   --  353  --   --   --  325  --   APTT  --   --  47*   < >  --  105*   < > >200* 21* 80*  HEPARINUNFRC  --   --  >2.20*  --   --  >2.20*  --   --  1.10*  --   CREATININE 1.33*  --   --   --  1.07*  --   --   --  0.95  --    < > = values in this interval not displayed.    Estimated Creatinine Clearance: 51.1 mL/min (by C-G formula based on SCr of 0.95 mg/dL).   Assessment: 74 yr old female with acute nonocclusive RLL PE and extensive nonocculsive RLE DVT on 12/03/19. Patient was given a dose of Xarelto 15 mg at 1400 on 5/5 at PCP office. Pharmacy was  consulted for heparin. Given potential effect of Xarelto on heparin levels (falsely elevated), will monitor anticoagulation using aPTT until aPTT and heparin level correlate.  Pt underwent caval and RLE mechanical thrombectomy by IR this afternoon. Per RN, heparin infusion was continued throughout the procedure; IR MD wrote in procedure note to continue heparin and transition to weight-based Lovenox prior to discharge (he recommends 6 wks of Lovenox prior to transition to [place] if possible). Per RN, no issues with IV or bleeding observed post procedure.  5/7 AM update:  APTT therapeutic at 80 after small rate  decrease  Goal of Therapy: Heparin level 0.3-0.7 units/ml  aPTT 66 -102 sec  Monitor platelets by anticoagulation protocol: Yes  Plan: Continue heparin at 750 units/hr Confirmatory aPTT at 1300 Monitor daily aPTT, heparin level, CBC/plt Monitor for signs/symptoms of bleeding   Narda Bonds, PharmD, BCPS Clinical Pharmacist Phone: [phone]

## 2019-12-09 ENCOUNTER — Other Ambulatory Visit (HOSPITAL_COMMUNITY): Payer: Self-pay | Admitting: Internal Medicine

## 2019-12-09 ENCOUNTER — Other Ambulatory Visit: Payer: Self-pay | Admitting: Interventional Radiology

## 2019-12-09 ENCOUNTER — Encounter (HOSPITAL_COMMUNITY): Payer: Self-pay

## 2019-12-09 DIAGNOSIS — I82401 Acute embolism and thrombosis of unspecified deep veins of right lower extremity: Secondary | ICD-10-CM

## 2019-12-09 DIAGNOSIS — I82409 Acute embolism and thrombosis of unspecified deep veins of unspecified lower extremity: Secondary | ICD-10-CM

## 2019-12-10 DIAGNOSIS — N858 Other specified noninflammatory disorders of uterus: Secondary | ICD-10-CM | POA: Diagnosis not present

## 2019-12-10 DIAGNOSIS — I2699 Other pulmonary embolism without acute cor pulmonale: Secondary | ICD-10-CM | POA: Diagnosis not present

## 2019-12-10 DIAGNOSIS — I82421 Acute embolism and thrombosis of right iliac vein: Secondary | ICD-10-CM | POA: Diagnosis not present

## 2019-12-10 DIAGNOSIS — I10 Essential (primary) hypertension: Secondary | ICD-10-CM | POA: Diagnosis not present

## 2019-12-10 DIAGNOSIS — E041 Nontoxic single thyroid nodule: Secondary | ICD-10-CM | POA: Diagnosis not present

## 2019-12-10 DIAGNOSIS — D6851 Activated protein C resistance: Secondary | ICD-10-CM | POA: Diagnosis not present

## 2019-12-11 ENCOUNTER — Other Ambulatory Visit: Payer: Self-pay | Admitting: Internal Medicine

## 2019-12-11 ENCOUNTER — Ambulatory Visit
Admission: RE | Admit: 2019-12-11 | Discharge: 2019-12-11 | Disposition: A | Discharge status: Home / Self Care | Payer: Medicare HMO | Source: Ambulatory Visit | Attending: Internal Medicine | Admitting: Internal Medicine

## 2019-12-11 DIAGNOSIS — E041 Nontoxic single thyroid nodule: Secondary | ICD-10-CM

## 2019-12-12 DIAGNOSIS — Z86711 Personal history of pulmonary embolism: Secondary | ICD-10-CM | POA: Diagnosis not present

## 2019-12-12 DIAGNOSIS — Z86718 Personal history of other venous thrombosis and embolism: Secondary | ICD-10-CM | POA: Diagnosis not present

## 2019-12-12 DIAGNOSIS — R21 Rash and other nonspecific skin eruption: Secondary | ICD-10-CM | POA: Diagnosis not present

## 2019-12-16 ENCOUNTER — Other Ambulatory Visit: Payer: Self-pay | Admitting: Internal Medicine

## 2019-12-22 DIAGNOSIS — N859 Noninflammatory disorder of uterus, unspecified: Secondary | ICD-10-CM | POA: Diagnosis not present

## 2020-01-06 DIAGNOSIS — Z86718 Personal history of other venous thrombosis and embolism: Secondary | ICD-10-CM | POA: Diagnosis not present

## 2020-01-06 DIAGNOSIS — E041 Nontoxic single thyroid nodule: Secondary | ICD-10-CM | POA: Diagnosis not present

## 2020-01-06 DIAGNOSIS — R21 Rash and other nonspecific skin eruption: Secondary | ICD-10-CM | POA: Diagnosis not present

## 2020-01-06 DIAGNOSIS — Z86711 Personal history of pulmonary embolism: Secondary | ICD-10-CM | POA: Diagnosis not present

## 2020-01-20 DIAGNOSIS — I10 Essential (primary) hypertension: Secondary | ICD-10-CM | POA: Diagnosis not present

## 2020-01-20 DIAGNOSIS — D6851 Activated protein C resistance: Secondary | ICD-10-CM | POA: Diagnosis not present

## 2020-01-20 DIAGNOSIS — J309 Allergic rhinitis, unspecified: Secondary | ICD-10-CM | POA: Diagnosis not present

## 2020-01-20 DIAGNOSIS — E041 Nontoxic single thyroid nodule: Secondary | ICD-10-CM | POA: Diagnosis not present

## 2020-01-20 DIAGNOSIS — Z1159 Encounter for screening for other viral diseases: Secondary | ICD-10-CM | POA: Diagnosis not present

## 2020-01-20 DIAGNOSIS — Z0000 Encounter for general adult medical examination without abnormal findings: Secondary | ICD-10-CM | POA: Diagnosis not present

## 2020-01-20 DIAGNOSIS — Z86711 Personal history of pulmonary embolism: Secondary | ICD-10-CM | POA: Diagnosis not present

## 2020-01-20 DIAGNOSIS — Z86718 Personal history of other venous thrombosis and embolism: Secondary | ICD-10-CM | POA: Diagnosis not present

## 2020-01-20 DIAGNOSIS — Z1389 Encounter for screening for other disorder: Secondary | ICD-10-CM | POA: Diagnosis not present

## 2020-01-20 DIAGNOSIS — E7800 Pure hypercholesterolemia, unspecified: Secondary | ICD-10-CM | POA: Diagnosis not present

## 2020-01-27 ENCOUNTER — Ambulatory Visit
Admission: RE | Admit: 2020-01-27 | Discharge: 2020-01-27 | Disposition: A | Discharge status: Home / Self Care | Payer: Medicare HMO | Source: Ambulatory Visit | Attending: Interventional Radiology | Admitting: Interventional Radiology

## 2020-01-27 ENCOUNTER — Encounter: Payer: Self-pay | Admitting: *Deleted

## 2020-01-27 ENCOUNTER — Ambulatory Visit
Admission: RE | Admit: 2020-01-27 | Discharge: 2020-01-27 | Disposition: A | Discharge status: Home / Self Care | Payer: Medicare HMO | Source: Ambulatory Visit | Attending: Physician Assistant | Admitting: Physician Assistant

## 2020-01-27 DIAGNOSIS — I82401 Acute embolism and thrombosis of unspecified deep veins of right lower extremity: Secondary | ICD-10-CM

## 2020-01-27 DIAGNOSIS — I82411 Acute embolism and thrombosis of right femoral vein: Secondary | ICD-10-CM | POA: Diagnosis not present

## 2020-01-27 DIAGNOSIS — I82431 Acute embolism and thrombosis of right popliteal vein: Secondary | ICD-10-CM | POA: Diagnosis not present

## 2020-01-27 HISTORY — PX: IR RADIOLOGIST EVAL & MGMT: IMG5224

## 2020-01-27 NOTE — Progress Notes (Signed)
Chief Complaint: Patient was seen in consultation today for iliocaval and extensive RLE DVT at the request of [place] A  Referring Physician(s): Aline August, MD  History of Present Illness: Belinda Perry is a 74 y.o. female who I initially saw as an inpatient at [facility] on 12/04/2019.  She had presented with severe right lower extremity pain and swelling and was diagnosed with extensive right lower extremity DVT extending throughout the iliac veins and into the IVC to the level of the renal veins.  She underwent an extensive mechanical thrombectomy of her ileocaval and right lower extremity DVT and was successfully discharged home on 12/05/2019.  She presents today for follow-up evaluation.  She reports that she currently feels great and has returned to her normal level of activity since her discharge from the hospital.  She has been able to garden and move about the house very well.  She does continue to have some intermittent lower extremity swelling which requires her to lay down and prop her feet up for about an hour.  Interestingly, she notes that her left lower extremity swells as much or more than her right.  Her DVT ultrasound today demonstrated no evidence of DVT in the left lower extremity but there is some recurrent DVT on the right.  She does have some occasional pains in her right thigh and calf but this has been going on for many years.  She does not feel it is the same as when she had her extensive thrombus.  Overall, she is extremely happy with her result and pleased with her care.  She does report some bruising and subcutaneous granuloma formation along her lower abdomen at the site of her low molecular weight heparin injections.  She is excited at the prospect of transitioning to an oral medication.  I discussed with her that she will likely require lifelong anticoagulation.  She understands.  She denies current chest pain, shortness of breath, fever,  chills or other systemic symptoms.  Past Medical History:  Diagnosis Date  . Allergic reaction to inhaled pollen   . Allergy   . Hyperlipidemia   . Hypertension   . Osteoarthritis, generalized   . Other osteoporosis   . Skin cancer, basal cell 1994   face    Past Surgical History:  Procedure Laterality Date  . AUGMENTATION MAMMAPLASTY Bilateral   . BREAST ENHANCEMENT SURGERY Bilateral 1973  . COLONOSCOPY    . FOOT SURGERY Bilateral    right 8/12, 11/12 for left. Dr Milinda Pointer  . IR PTA VENOUS EXCEPT DIALYSIS CIRCUIT  12/04/2019  . IR RADIOLOGIST EVAL & MGMT  01/27/2020  . IR THROMBECT VENO MECH MOD SED  12/04/2019  . IR US GUIDE VASC ACCESS LEFT  12/04/2019  . IR US GUIDE VASC ACCESS RIGHT  12/04/2019  . IR VENO/EXT/BI  12/04/2019  . IR VENO/EXT/BI  12/04/2019  . IR VENOCAVAGRAM IVC  12/04/2019  . POLYPECTOMY    . TONSILLECTOMY N/A 1970  . TUBAL LIGATION      Allergies: Codeine  Medications: Prior to Admission medications   Medication Sig Start Date End Date Taking? Authorizing Provider  BIOTIN PO Take 1 tablet by mouth.    [provider]  cetirizine (ZYRTEC) 10 MG tablet Take 10 mg by mouth daily as needed for allergies.    [provider]  enoxaparin (LOVENOX) 60 MG/0.6ML injection Inject 0.6 mLs (60 mg total) into the skin every 12 (twelve) hours. 12/05/19 01/16/20  Aline August, MD  fluticasone (FLONASE) 50 MCG/ACT nasal spray Place 2 sprays into both nostrils daily as needed for allergies or rhinitis.    [provider]  Multiple Vitamins-Minerals (MULTIVITAL) CHEW Chew 1 tablet by mouth daily.    [provider]  potassium chloride (KLOR-CON) 10 MEQ tablet Take 10 mEq by mouth daily. 12/02/19   [provider]  Probiotic Product (PROBIOTIC DAILY PO) Take 1 tablet by mouth.    [provider]  simvastatin (ZOCOR) 40 MG tablet TAKE 1 TABLET (40 MG TOTAL) BY MOUTH AT BEDTIME. Patient taking differently: Take 40 mg by mouth at bedtime.   11/30/16   Venia Carbon, MD     Family History  Problem Relation Age of Onset  . Heart disease Mother   . Heart disease Father   . Diabetes Father   . Hypertension Father   . Stroke Sister   . Heart disease Brother   . Cancer Maternal Grandfather        colon  . Colon cancer Maternal Grandfather 96  . Cancer Other        various cancers  . Colon cancer Paternal Uncle 48  . Colon polyps Neg Hx   . Esophageal cancer Neg Hx   . Rectal cancer Neg Hx   . Stomach cancer Neg Hx     Social History   Socioeconomic History  . Marital status: Married    Spouse name: Not on file  . Number of children: 1  . Years of education: Not on file  . Highest education level: Not on file  Occupational History  . 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 Smoking status: Former Smoker    Types: Cigarettes  . Smokeless tobacco: Never Used  Substance and Sexual Activity  . Alcohol use: No  . Drug use: No  . Sexual activity: Not on file  Other Topics Concern  . Not on file  Social History Narrative   1 daughter      No living will   Husband should be health care POA   Would accept resuscitation but no prolonged machines   No feeding tubes if cognitively unaware   Social Determinants of Health   Financial Resource Strain:   . Difficulty of Paying Living Expenses:   Food Insecurity:   . Worried About Charity fundraiser in the Last Year:   . Arboriculturist in the Last Year:   Transportation Needs:   . Film/video editor (Medical):   Marland Kitchen Lack of Transportation (Non-Medical):   Physical Activity:   . Days of Exercise per Week:   . Minutes of Exercise per Session:   Stress:   . Feeling of Stress :   Social Connections:   . Frequency of Communication with Friends and Family:   . Frequency of Social Gatherings with Friends and Family:   . Attends Religious Services:   . Active Member of Clubs or Organizations:   . Attends Theatre manager Meetings:   Marland Kitchen Marital Status:      Review of Systems: A 12 point ROS discussed and pertinent positives are indicated in the HPI above.  All other systems are negative.  Review of Systems  Vital Signs: There were no vitals taken for this visit.  Physical Exam Constitutional:      Appearance: Normal appearance.  HENT:     Head: Normocephalic and atraumatic.  Eyes:     General: No scleral icterus. Cardiovascular:  Rate and Rhythm: Normal rate and regular rhythm.  Pulmonary:     Effort: Pulmonary effort is normal.  Abdominal:     General: Abdomen is flat.     Palpations: Abdomen is soft.  Musculoskeletal:     Comments: Mild 1+ edema RLE ankle to mid shin.   The right calf is larger than the l;eft.  Bilateral popliteal venous access sites are well healed and not TTP.   Skin:    General: Skin is warm and dry.  Neurological:     Mental Status: She is alert and oriented to person, place, and time.  Psychiatric:        Mood and Affect: Mood normal.        Behavior: Behavior normal.      Imaging: IR Radiologist Eval & Mgmt  Result Date: 01/27/2020 Please refer to notes tab for details about interventional procedure. (Op Note)   Labs:  CBC: Recent Labs    12/03/19 1449 12/04/19 0240 12/05/19 0037  WBC 9.7 8.4 9.4  HGB 12.8 11.2* 10.9*  HCT 39.3 34.3* 33.7*  PLT 411* 353 325    COAGS: Recent Labs    12/04/19 2022 12/05/19 0037 12/05/19 0243 12/05/19 1230  APTT >200* 21* 80* 42*    BMP: Recent Labs    12/03/19 1449 12/04/19 0240 12/05/19 0037  NA 137 137 139  K 3.6 4.2 3.5  CL 98 105 107  CO2 26 25 24   GLUCOSE 112* 113* 116*  BUN 27* 20 15  CALCIUM 9.9 9.1 8.9  CREATININE 1.33* 1.07* 0.95  GFRNONAA 40* 52* >60  GFRAA 46* >60 >60    LIVER FUNCTION TESTS: Recent Labs    12/04/19 0240 12/05/19 0037  BILITOT 0.9 1.1  AST 17 20  ALT 16 14  ALKPHOS 88 80  PROT 6.0* 5.7*  ALBUMIN 2.7* 2.6*    TUMOR MARKERS: No results  for input(s): AFPTM, CEA, CA199, CHROMGRNA in the last 8760 hours.  Assessment and Plan:  74 year old female overall doing exceptionally well 8 weeks status post ileocaval thrombectomy for large-volume deep venous thrombosis.  Duplex venous ultrasound today demonstrates no evidence of DVT in the left lower extremity.  Unfortunately, she does have some recurrent/residual DVT in the right femoral vein in the thigh and the right popliteal vein.  Importantly, her right common femoral vein is clear and there is no evidence to suggest recurrent iliac or caval thrombus.  She has been compliant with her Lovenox.  She has returned nearly to her baseline in terms of activity level and is extremely pleased with her results overall.  1.)  At this time, it is acceptable to transition from low molecular weight heparin to a DOAC.  I spoke with her primary care physician, Dr. Laurann Montana, and he plans on making this medication adjustment for her today. 2.)  I issued her a prescription for bilateral thigh-high compression hose which should help with her residual intermittent lower extremity swelling. 3.)  Return clinic visit in 3 months with repeat right lower extremity venous duplex ultrasound.  She knows to call my office to be seen sooner if she has any recurrent or worsening symptoms.   Electronically Signed: Jacqulynn Cadet 01/27/2020, 11:36 AM   I spent a total of  25 Minutes in face to face in clinical consultation, greater than 50% of which was counseling/coordinating care for deep venous thrombosis.

## 2020-01-28 DIAGNOSIS — I10 Essential (primary) hypertension: Secondary | ICD-10-CM | POA: Diagnosis not present

## 2020-01-28 DIAGNOSIS — E7800 Pure hypercholesterolemia, unspecified: Secondary | ICD-10-CM | POA: Diagnosis not present

## 2020-02-10 DIAGNOSIS — L710 Perioral dermatitis: Secondary | ICD-10-CM | POA: Diagnosis not present

## 2020-02-23 DIAGNOSIS — E7800 Pure hypercholesterolemia, unspecified: Secondary | ICD-10-CM | POA: Diagnosis not present

## 2020-02-23 DIAGNOSIS — I10 Essential (primary) hypertension: Secondary | ICD-10-CM | POA: Diagnosis not present

## 2020-03-10 ENCOUNTER — Other Ambulatory Visit: Payer: Self-pay | Admitting: Interventional Radiology

## 2020-03-10 DIAGNOSIS — I82401 Acute embolism and thrombosis of unspecified deep veins of right lower extremity: Secondary | ICD-10-CM

## 2020-03-23 DIAGNOSIS — L538 Other specified erythematous conditions: Secondary | ICD-10-CM | POA: Diagnosis not present

## 2020-03-23 DIAGNOSIS — D2262 Melanocytic nevi of left upper limb, including shoulder: Secondary | ICD-10-CM | POA: Diagnosis not present

## 2020-03-23 DIAGNOSIS — B079 Viral wart, unspecified: Secondary | ICD-10-CM | POA: Diagnosis not present

## 2020-03-23 DIAGNOSIS — L820 Inflamed seborrheic keratosis: Secondary | ICD-10-CM | POA: Diagnosis not present

## 2020-03-23 DIAGNOSIS — R58 Hemorrhage, not elsewhere classified: Secondary | ICD-10-CM | POA: Diagnosis not present

## 2020-03-23 DIAGNOSIS — L298 Other pruritus: Secondary | ICD-10-CM | POA: Diagnosis not present

## 2020-03-23 DIAGNOSIS — R208 Other disturbances of skin sensation: Secondary | ICD-10-CM | POA: Diagnosis not present

## 2020-03-23 DIAGNOSIS — D485 Neoplasm of uncertain behavior of skin: Secondary | ICD-10-CM | POA: Diagnosis not present

## 2020-03-23 DIAGNOSIS — L821 Other seborrheic keratosis: Secondary | ICD-10-CM | POA: Diagnosis not present

## 2020-03-23 DIAGNOSIS — D225 Melanocytic nevi of trunk: Secondary | ICD-10-CM | POA: Diagnosis not present

## 2020-03-23 DIAGNOSIS — D2261 Melanocytic nevi of right upper limb, including shoulder: Secondary | ICD-10-CM | POA: Diagnosis not present

## 2020-03-30 DIAGNOSIS — E7800 Pure hypercholesterolemia, unspecified: Secondary | ICD-10-CM | POA: Diagnosis not present

## 2020-03-30 DIAGNOSIS — I10 Essential (primary) hypertension: Secondary | ICD-10-CM | POA: Diagnosis not present

## 2020-03-31 DIAGNOSIS — E7800 Pure hypercholesterolemia, unspecified: Secondary | ICD-10-CM | POA: Diagnosis not present

## 2020-03-31 DIAGNOSIS — I10 Essential (primary) hypertension: Secondary | ICD-10-CM | POA: Diagnosis not present

## 2020-05-19 DIAGNOSIS — Z86711 Personal history of pulmonary embolism: Secondary | ICD-10-CM | POA: Diagnosis not present

## 2020-05-19 DIAGNOSIS — E041 Nontoxic single thyroid nodule: Secondary | ICD-10-CM | POA: Diagnosis not present

## 2020-05-19 DIAGNOSIS — R5383 Other fatigue: Secondary | ICD-10-CM | POA: Diagnosis not present

## 2020-05-19 DIAGNOSIS — I10 Essential (primary) hypertension: Secondary | ICD-10-CM | POA: Diagnosis not present

## 2020-05-19 DIAGNOSIS — Z86718 Personal history of other venous thrombosis and embolism: Secondary | ICD-10-CM | POA: Diagnosis not present

## 2020-05-19 DIAGNOSIS — R29898 Other symptoms and signs involving the musculoskeletal system: Secondary | ICD-10-CM | POA: Diagnosis not present

## 2020-05-19 DIAGNOSIS — J309 Allergic rhinitis, unspecified: Secondary | ICD-10-CM | POA: Diagnosis not present

## 2020-05-19 DIAGNOSIS — J018 Other acute sinusitis: Secondary | ICD-10-CM | POA: Diagnosis not present

## 2020-05-19 DIAGNOSIS — Z23 Encounter for immunization: Secondary | ICD-10-CM | POA: Diagnosis not present

## 2020-05-20 ENCOUNTER — Other Ambulatory Visit: Payer: Self-pay | Admitting: Internal Medicine

## 2020-05-20 DIAGNOSIS — R29898 Other symptoms and signs involving the musculoskeletal system: Secondary | ICD-10-CM

## 2020-05-25 ENCOUNTER — Ambulatory Visit
Admission: RE | Admit: 2020-05-25 | Discharge: 2020-05-25 | Disposition: A | Discharge status: Home / Self Care | Payer: Medicare HMO | Source: Ambulatory Visit | Attending: Interventional Radiology | Admitting: Interventional Radiology

## 2020-05-25 ENCOUNTER — Encounter: Payer: Self-pay | Admitting: Radiology

## 2020-05-25 DIAGNOSIS — Z7901 Long term (current) use of anticoagulants: Secondary | ICD-10-CM | POA: Diagnosis not present

## 2020-05-25 DIAGNOSIS — I82423 Acute embolism and thrombosis of iliac vein, bilateral: Secondary | ICD-10-CM | POA: Diagnosis not present

## 2020-05-25 DIAGNOSIS — I82401 Acute embolism and thrombosis of unspecified deep veins of right lower extremity: Secondary | ICD-10-CM

## 2020-05-25 DIAGNOSIS — I82220 Acute embolism and thrombosis of inferior vena cava: Secondary | ICD-10-CM | POA: Diagnosis not present

## 2020-05-25 DIAGNOSIS — I82531 Chronic embolism and thrombosis of right popliteal vein: Secondary | ICD-10-CM | POA: Diagnosis not present

## 2020-05-25 HISTORY — PX: IR RADIOLOGIST EVAL & MGMT: IMG5224

## 2020-05-25 NOTE — Progress Notes (Signed)
Chief Complaint: Patient was seen in follow-up today for iliocaval DVT at the request of Azhar Knope K  Referring Physician(s): Anias Bartol K  History of Present Illness: Belinda Perry is a 74 y.o. female who I initially saw as an inpatient at [facility] on 12/04/2019.  She had presented with severe right lower extremity pain and swelling and was diagnosed with extensive right lower extremity DVT extending throughout the iliac veins and into the IVC to the level of the renal veins.  She underwent an extensive mechanical thrombectomy of her ileocaval and right lower extremity DVT and was successfully discharged home on 12/05/2019.  Belinda Perry is pleased to report that she is doing very well.  Her lower extremity edema has completely resolved.  She continues to wear her thigh-high compression hose most days.  Today, she is wearing only knee-high compression hose to facilitate our physical exam.  She notes that she is able to ambulate without any issues.  Both she and her husband are extremely pleased with her outcome.  Her only complaint is that she recently suffered from a sinus infection requiring antibiotics.  Fortunately, she has now recovered from this.   She denies current chest pain, shortness of breath, fever, chills or other systemic symptoms.  Past Medical History:  Diagnosis Date  . Allergic reaction to inhaled pollen   . Allergy   . Hyperlipidemia   . Hypertension   . Osteoarthritis, generalized   . Other osteoporosis   . Skin cancer, basal cell 1994   face    Past Surgical History:  Procedure Laterality Date  . AUGMENTATION MAMMAPLASTY Bilateral   . BREAST ENHANCEMENT SURGERY Bilateral 1973  . COLONOSCOPY    . FOOT SURGERY Bilateral    right 8/12, 11/12 for left. Dr Milinda Pointer  . IR PTA VENOUS EXCEPT DIALYSIS CIRCUIT  12/04/2019  . IR RADIOLOGIST EVAL & MGMT  01/27/2020  . IR THROMBECT VENO MECH MOD SED  12/04/2019  . IR US GUIDE VASC ACCESS LEFT   12/04/2019  . IR US GUIDE VASC ACCESS RIGHT  12/04/2019  . IR VENO/EXT/BI  12/04/2019  . IR VENO/EXT/BI  12/04/2019  . IR VENOCAVAGRAM IVC  12/04/2019  . POLYPECTOMY    . TONSILLECTOMY N/A 1970  . TUBAL LIGATION      Allergies: Codeine  Medications: Prior to Admission medications   Medication Sig Start Date End Date Taking? Authorizing Provider  BIOTIN PO Take 1 tablet by mouth.    [provider]  cetirizine (ZYRTEC) 10 MG tablet Take 10 mg by mouth daily as needed for allergies.    [provider]  enoxaparin (LOVENOX) 60 MG/0.6ML injection Inject 0.6 mLs (60 mg total) into the skin every 12 (twelve) hours. 12/05/19 01/16/20  Aline August, MD  fluticasone (FLONASE) 50 MCG/ACT nasal spray Place 2 sprays into both nostrils daily as needed for allergies or rhinitis.    [provider]  Multiple Vitamins-Minerals (MULTIVITAL) CHEW Chew 1 tablet by mouth daily.    [provider]  potassium chloride (KLOR-CON) 10 MEQ tablet Take 10 mEq by mouth daily. 12/02/19   [provider]  Probiotic Product (PROBIOTIC DAILY PO) Take 1 tablet by mouth.    [provider]  simvastatin (ZOCOR) 40 MG tablet TAKE 1 TABLET (40 MG TOTAL) BY MOUTH AT BEDTIME. Patient taking differently: Take 40 mg by mouth at bedtime.  11/30/16   Venia Carbon, MD     Family History  Problem Relation Age of Onset  .  Heart disease Mother   . Heart disease Father   . Diabetes Father   . Hypertension Father   . Stroke Sister   . Heart disease Brother   . Cancer Maternal Grandfather        colon  . Colon cancer Maternal Grandfather 17  . Cancer Other        various cancers  . Colon cancer Paternal Uncle 21  . Colon polyps Neg Hx   . Esophageal cancer Neg Hx   . Rectal cancer Neg Hx   . Stomach cancer Neg Hx     Social History   Socioeconomic History  . Marital status: Married    Spouse name: Not on file  . Number of children: 1  . Years of education: Not on file    . Highest education level: Not on file  Occupational History  . 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 Smoking status: Former Smoker    Types: Cigarettes  . Smokeless tobacco: Never Used  Substance and Sexual Activity  . Alcohol use: No  . Drug use: No  . Sexual activity: Not on file  Other Topics Concern  . Not on file  Social History Narrative   1 daughter      No living will   Husband should be health care POA   Would accept resuscitation but no prolonged machines   No feeding tubes if cognitively unaware   Social Determinants of Health   Financial Resource Strain:   . Difficulty of Paying Living Expenses: Not on file  Food Insecurity:   . Worried About Charity fundraiser in the Last Year: Not on file  . Ran Out of Food in the Last Year: Not on file  Transportation Needs:   . Lack of Transportation (Medical): Not on file  . Lack of Transportation (Non-Medical): Not on file  Physical Activity:   . Days of Exercise per Week: Not on file  . Minutes of Exercise per Session: Not on file  Stress:   . Feeling of Stress : Not on file  Social Connections:   . Frequency of Communication with Friends and Family: Not on file  . Frequency of Social Gatherings with Friends and Family: Not on file  . Attends Religious Services: Not on file  . Active Member of Clubs or Organizations: Not on file  . Attends Archivist Meetings: Not on file  . Marital Status: Not on file    Review of Systems: A 12 point ROS discussed and pertinent positives are indicated in the HPI above.  All other systems are negative.  Review of Systems  Vital Signs: There were no vitals taken for this visit.  Physical Exam Vitals and nursing note reviewed.  Constitutional:      Appearance: Normal appearance.  HENT:     Head: Normocephalic and atraumatic.  Eyes:     General: No scleral icterus. Cardiovascular:     Rate and Rhythm: Normal rate.   Pulmonary:     Effort: Pulmonary effort is normal.  Abdominal:     General: Abdomen is flat.     Palpations: Abdomen is soft.  Musculoskeletal:        General: No swelling or tenderness.     Right lower leg: No edema.     Left lower leg: No edema.  Skin:    General: Skin is warm and dry.  Neurological:     Mental Status: She is alert  and oriented to person, place, and time.  Psychiatric:        Mood and Affect: Mood normal.        Behavior: Behavior normal.      Imaging: No results found.  Labs:  CBC: Recent Labs    12/03/19 1449 12/04/19 0240 12/05/19 0037  WBC 9.7 8.4 9.4  HGB 12.8 11.2* 10.9*  HCT 39.3 34.3* 33.7*  PLT 411* 353 325    COAGS: Recent Labs    12/04/19 2022 12/05/19 0037 12/05/19 0243 12/05/19 1230  APTT >200* 21* 80* 42*    BMP: Recent Labs    12/03/19 1449 12/04/19 0240 12/05/19 0037  NA 137 137 139  K 3.6 4.2 3.5  CL 98 105 107  CO2 26 25 24   GLUCOSE 112* 113* 116*  BUN 27* 20 15  CALCIUM 9.9 9.1 8.9  CREATININE 1.33* 1.07* 0.95  GFRNONAA 40* 52* >60  GFRAA 46* >60 >60    LIVER FUNCTION TESTS: Recent Labs    12/04/19 0240 12/05/19 0037  BILITOT 0.9 1.1  AST 17 20  ALT 16 14  ALKPHOS 88 80  PROT 6.0* 5.7*  ALBUMIN 2.7* 2.6*    TUMOR MARKERS: No results for input(s): AFPTM, CEA, CA199, CHROMGRNA in the last 8760 hours.  Assessment and Plan:  Doing extremely well 5 months status post bilateral lower extremity and caval venous thrombectomy.  Her lower extremity edema symptoms have completely resolved.  She continues to wear her compression hose and finds them quite comfortable and helpful.  Other than a recent sinus infection, she is doing great.  1.)  Continue lifelong anticoagulation. 2.)  Continue use of compression hose as needed. 3.)  At this point, we will follow Ms. Eagle yearly.  Bilateral lower extremity venous ultrasound and clinic visit in 1 year.    Electronically Signed: Criselda Peaches 05/25/2020, 12:02 PM   I spent a total of 15 Minutes in face to face in clinical consultation, greater than 50% of which was counseling/coordinating care for bilateral lower extremity and caval DVT.

## 2020-05-27 DIAGNOSIS — I10 Essential (primary) hypertension: Secondary | ICD-10-CM | POA: Diagnosis not present

## 2020-05-27 DIAGNOSIS — E7800 Pure hypercholesterolemia, unspecified: Secondary | ICD-10-CM | POA: Diagnosis not present

## 2020-06-07 ENCOUNTER — Ambulatory Visit
Admission: RE | Admit: 2020-06-07 | Discharge: 2020-06-07 | Disposition: A | Discharge status: Home / Self Care | Payer: Medicare HMO | Source: Ambulatory Visit | Attending: Internal Medicine | Admitting: Internal Medicine

## 2020-06-07 DIAGNOSIS — I739 Peripheral vascular disease, unspecified: Secondary | ICD-10-CM | POA: Diagnosis not present

## 2020-06-07 DIAGNOSIS — R29898 Other symptoms and signs involving the musculoskeletal system: Secondary | ICD-10-CM

## 2020-06-15 DIAGNOSIS — R29898 Other symptoms and signs involving the musculoskeletal system: Secondary | ICD-10-CM | POA: Diagnosis not present

## 2020-06-15 DIAGNOSIS — J309 Allergic rhinitis, unspecified: Secondary | ICD-10-CM | POA: Diagnosis not present

## 2020-06-15 DIAGNOSIS — E7800 Pure hypercholesterolemia, unspecified: Secondary | ICD-10-CM | POA: Diagnosis not present

## 2020-06-15 DIAGNOSIS — J019 Acute sinusitis, unspecified: Secondary | ICD-10-CM | POA: Diagnosis not present

## 2020-06-15 DIAGNOSIS — E041 Nontoxic single thyroid nodule: Secondary | ICD-10-CM | POA: Diagnosis not present

## 2020-06-15 DIAGNOSIS — I739 Peripheral vascular disease, unspecified: Secondary | ICD-10-CM | POA: Diagnosis not present

## 2020-06-30 DIAGNOSIS — E7800 Pure hypercholesterolemia, unspecified: Secondary | ICD-10-CM | POA: Diagnosis not present

## 2020-06-30 DIAGNOSIS — I10 Essential (primary) hypertension: Secondary | ICD-10-CM | POA: Diagnosis not present

## 2020-09-16 ENCOUNTER — Other Ambulatory Visit: Payer: Self-pay | Admitting: Internal Medicine

## 2020-09-16 DIAGNOSIS — I10 Essential (primary) hypertension: Secondary | ICD-10-CM | POA: Diagnosis not present

## 2020-09-16 DIAGNOSIS — Z86711 Personal history of pulmonary embolism: Secondary | ICD-10-CM | POA: Diagnosis not present

## 2020-09-16 DIAGNOSIS — E041 Nontoxic single thyroid nodule: Secondary | ICD-10-CM

## 2020-09-16 DIAGNOSIS — E7800 Pure hypercholesterolemia, unspecified: Secondary | ICD-10-CM | POA: Diagnosis not present

## 2020-09-21 DIAGNOSIS — I10 Essential (primary) hypertension: Secondary | ICD-10-CM | POA: Diagnosis not present

## 2020-09-21 DIAGNOSIS — E7800 Pure hypercholesterolemia, unspecified: Secondary | ICD-10-CM | POA: Diagnosis not present

## 2020-09-28 ENCOUNTER — Other Ambulatory Visit (HOSPITAL_COMMUNITY)
Admission: RE | Admit: 2020-09-28 | Discharge: 2020-09-28 | Disposition: A | Discharge status: Home / Self Care | Payer: Medicare HMO | Source: Ambulatory Visit | Attending: Student | Admitting: Student

## 2020-09-28 ENCOUNTER — Ambulatory Visit
Admission: RE | Admit: 2020-09-28 | Discharge: 2020-09-28 | Disposition: A | Discharge status: Home / Self Care | Payer: Medicare HMO | Source: Ambulatory Visit | Attending: Internal Medicine | Admitting: Internal Medicine

## 2020-09-28 DIAGNOSIS — E041 Nontoxic single thyroid nodule: Secondary | ICD-10-CM

## 2020-09-28 DIAGNOSIS — D34 Benign neoplasm of thyroid gland: Secondary | ICD-10-CM | POA: Diagnosis not present

## 2020-09-29 LAB — CYTOLOGY - NON PAP

## 2020-12-16 DIAGNOSIS — E7800 Pure hypercholesterolemia, unspecified: Secondary | ICD-10-CM | POA: Diagnosis not present

## 2020-12-16 DIAGNOSIS — I10 Essential (primary) hypertension: Secondary | ICD-10-CM | POA: Diagnosis not present

## 2021-01-21 DIAGNOSIS — D6869 Other thrombophilia: Secondary | ICD-10-CM | POA: Diagnosis not present

## 2021-01-21 DIAGNOSIS — E041 Nontoxic single thyroid nodule: Secondary | ICD-10-CM | POA: Diagnosis not present

## 2021-01-21 DIAGNOSIS — J439 Emphysema, unspecified: Secondary | ICD-10-CM | POA: Diagnosis not present

## 2021-01-21 DIAGNOSIS — Z86711 Personal history of pulmonary embolism: Secondary | ICD-10-CM | POA: Diagnosis not present

## 2021-01-21 DIAGNOSIS — Z0000 Encounter for general adult medical examination without abnormal findings: Secondary | ICD-10-CM | POA: Diagnosis not present

## 2021-01-21 DIAGNOSIS — I739 Peripheral vascular disease, unspecified: Secondary | ICD-10-CM | POA: Diagnosis not present

## 2021-01-21 DIAGNOSIS — J309 Allergic rhinitis, unspecified: Secondary | ICD-10-CM | POA: Diagnosis not present

## 2021-01-21 DIAGNOSIS — I10 Essential (primary) hypertension: Secondary | ICD-10-CM | POA: Diagnosis not present

## 2021-01-21 DIAGNOSIS — Z86718 Personal history of other venous thrombosis and embolism: Secondary | ICD-10-CM | POA: Diagnosis not present

## 2021-01-21 DIAGNOSIS — E7800 Pure hypercholesterolemia, unspecified: Secondary | ICD-10-CM | POA: Diagnosis not present

## 2021-01-21 DIAGNOSIS — Z1389 Encounter for screening for other disorder: Secondary | ICD-10-CM | POA: Diagnosis not present

## 2021-01-21 DIAGNOSIS — D6851 Activated protein C resistance: Secondary | ICD-10-CM | POA: Diagnosis not present

## 2021-01-21 DIAGNOSIS — I700 Atherosclerosis of aorta: Secondary | ICD-10-CM | POA: Diagnosis not present

## 2021-02-14 DIAGNOSIS — J309 Allergic rhinitis, unspecified: Secondary | ICD-10-CM | POA: Diagnosis not present

## 2021-02-14 DIAGNOSIS — I10 Essential (primary) hypertension: Secondary | ICD-10-CM | POA: Diagnosis not present

## 2021-03-17 DIAGNOSIS — I10 Essential (primary) hypertension: Secondary | ICD-10-CM | POA: Diagnosis not present

## 2021-03-17 DIAGNOSIS — J439 Emphysema, unspecified: Secondary | ICD-10-CM | POA: Diagnosis not present

## 2021-03-17 DIAGNOSIS — E7800 Pure hypercholesterolemia, unspecified: Secondary | ICD-10-CM | POA: Diagnosis not present

## 2021-03-22 DIAGNOSIS — L821 Other seborrheic keratosis: Secondary | ICD-10-CM | POA: Diagnosis not present

## 2021-03-22 DIAGNOSIS — X32XXXA Exposure to sunlight, initial encounter: Secondary | ICD-10-CM | POA: Diagnosis not present

## 2021-03-22 DIAGNOSIS — R208 Other disturbances of skin sensation: Secondary | ICD-10-CM | POA: Diagnosis not present

## 2021-03-22 DIAGNOSIS — L820 Inflamed seborrheic keratosis: Secondary | ICD-10-CM | POA: Diagnosis not present

## 2021-03-22 DIAGNOSIS — Z85828 Personal history of other malignant neoplasm of skin: Secondary | ICD-10-CM | POA: Diagnosis not present

## 2021-03-22 DIAGNOSIS — D2261 Melanocytic nevi of right upper limb, including shoulder: Secondary | ICD-10-CM | POA: Diagnosis not present

## 2021-03-22 DIAGNOSIS — D2262 Melanocytic nevi of left upper limb, including shoulder: Secondary | ICD-10-CM | POA: Diagnosis not present

## 2021-03-22 DIAGNOSIS — D225 Melanocytic nevi of trunk: Secondary | ICD-10-CM | POA: Diagnosis not present

## 2021-04-12 ENCOUNTER — Other Ambulatory Visit: Payer: Self-pay | Admitting: Interventional Radiology

## 2021-04-12 DIAGNOSIS — I82401 Acute embolism and thrombosis of unspecified deep veins of right lower extremity: Secondary | ICD-10-CM

## 2021-05-03 ENCOUNTER — Ambulatory Visit
Admission: RE | Admit: 2021-05-03 | Discharge: 2021-05-03 | Disposition: A | Discharge status: Home / Self Care | Payer: Medicare HMO | Source: Ambulatory Visit | Attending: Interventional Radiology | Admitting: Interventional Radiology

## 2021-05-03 ENCOUNTER — Encounter: Payer: Self-pay | Admitting: *Deleted

## 2021-05-03 DIAGNOSIS — Z86718 Personal history of other venous thrombosis and embolism: Secondary | ICD-10-CM | POA: Diagnosis not present

## 2021-05-03 DIAGNOSIS — I82401 Acute embolism and thrombosis of unspecified deep veins of right lower extremity: Secondary | ICD-10-CM

## 2021-05-03 DIAGNOSIS — Z9889 Other specified postprocedural states: Secondary | ICD-10-CM | POA: Diagnosis not present

## 2021-05-03 DIAGNOSIS — I82421 Acute embolism and thrombosis of right iliac vein: Secondary | ICD-10-CM | POA: Diagnosis not present

## 2021-05-03 DIAGNOSIS — I82411 Acute embolism and thrombosis of right femoral vein: Secondary | ICD-10-CM | POA: Diagnosis not present

## 2021-05-03 HISTORY — PX: IR RADIOLOGIST EVAL & MGMT: IMG5224

## 2021-05-03 NOTE — Progress Notes (Signed)
Chief Complaint: Patient was seen in consultation today for  iliocaval and extensive RLE DVT at the request of Necie Wilcoxson K  Referring Physician(s): Aline August, MD  History of Present Illness: Belinda Perry is a 75 y.o. female who I initially saw as an inpatient at [facility] on 12/04/2019.  She had presented with severe right lower extremity pain and swelling and was diagnosed with extensive right lower extremity DVT extending throughout the iliac veins and into the IVC to the level of the renal veins.  She underwent an extensive mechanical thrombectomy of her ileocaval and right lower extremity DVT and was successfully discharged home on 12/05/2019.   She presents today for 16 month follow-up evaluation.    She reports that she currently feels great and has returned to her normal level of activity since her discharge from the hospital.  She has been able to garden and move about the house very well.  She remains compliant with Xarelto which is prescribed by her primary care physician.  She has no significant residual issues with lower extremity edema and is able to wear "small cute shoes" again.  She does have some occasional pains in her right thigh and calf but this has been going on for many years.  She does not feel it is the same as when she had her extensive thrombus.  Overall, she is extremely happy with her result and pleased with her care.     She denies current chest pain, shortness of breath, fever, chills or other systemic symptoms.  Past Medical History:  Diagnosis Date   Allergic reaction to inhaled pollen    Allergy    Hyperlipidemia    Hypertension    Osteoarthritis, generalized    Other osteoporosis    Skin cancer, basal cell 1994   face    Past Surgical History:  Procedure Laterality Date   AUGMENTATION MAMMAPLASTY Bilateral    BREAST ENHANCEMENT SURGERY Bilateral 1973   COLONOSCOPY     FOOT SURGERY Bilateral    right 8/12, 11/12 for left.  Dr Milinda Pointer   IR PTA VENOUS EXCEPT DIALYSIS CIRCUIT  12/04/2019   IR RADIOLOGIST EVAL & MGMT  01/27/2020   IR RADIOLOGIST EVAL & MGMT  05/25/2020   IR RADIOLOGIST EVAL & MGMT  05/03/2021   IR THROMBECT VENO MECH MOD SED  12/04/2019   IR US GUIDE VASC ACCESS LEFT  12/04/2019   IR US GUIDE VASC ACCESS RIGHT  12/04/2019   IR VENO/EXT/BI  12/04/2019   IR VENO/EXT/BI  12/04/2019   IR VENOCAVAGRAM IVC  12/04/2019   POLYPECTOMY     TONSILLECTOMY N/A 1970   TUBAL LIGATION      Allergies: Codeine  Medications: Prior to Admission medications   Medication Sig Start Date End Date Taking? Authorizing Provider  BIOTIN PO Take 1 tablet by mouth.    [provider]  cetirizine (ZYRTEC) 10 MG tablet Take 10 mg by mouth daily as needed for allergies.    [provider]  enoxaparin (LOVENOX) 60 MG/0.6ML injection Inject 0.6 mLs (60 mg total) into the skin every 12 (twelve) hours. 12/05/19 01/16/20  Aline August, MD  fluticasone (FLONASE) 50 MCG/ACT nasal spray Place 2 sprays into both nostrils daily as needed for allergies or rhinitis.    [provider]  Multiple Vitamins-Minerals (MULTIVITAL) CHEW Chew 1 tablet by mouth daily.    [provider]  potassium chloride (KLOR-CON) 10 MEQ tablet Take 10 mEq by mouth daily. 12/02/19  [provider]  Probiotic Product (PROBIOTIC DAILY PO) Take 1 tablet by mouth.    [provider]  simvastatin (ZOCOR) 40 MG tablet TAKE 1 TABLET (40 MG TOTAL) BY MOUTH AT BEDTIME. Patient taking differently: Take 40 mg by mouth at bedtime.  11/30/16   Venia Carbon, MD     Family History  Problem Relation Age of Onset   Heart disease Mother    Heart disease Father    Diabetes Father    Hypertension Father    Stroke Sister    Heart disease Brother    Cancer Maternal Grandfather        colon   Colon cancer Maternal Grandfather 75   Cancer Other        various cancers   Colon cancer Paternal Uncle 33   Colon polyps Neg Hx     Esophageal cancer Neg Hx    Rectal cancer Neg Hx    Stomach cancer Neg Hx     Social History   Socioeconomic History   Marital status: Married    Spouse name: Not on file   Number of children: 1   Years of education: Not on file   Highest education level: Not on file  Occupational History   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  Tobacco Use   Smoking status: Former    Types: Cigarettes   Smokeless tobacco: Never  Substance and Sexual Activity   Alcohol use: No   Drug use: No   Sexual activity: Not on file  Other Topics Concern   Not on file  Social History Narrative   1 daughter      No living will   Husband should be health care POA   Would accept resuscitation but no prolonged machines   No feeding tubes if cognitively unaware   Social Determinants of Health   Financial Resource Strain: Not on file  Food Insecurity: Not on file  Transportation Needs: Not on file  Physical Activity: Not on file  Stress: Not on file  Social Connections: Not on file    Review of Systems: A 12 point ROS discussed and pertinent positives are indicated in the HPI above.  All other systems are negative.  Review of Systems  Vital Signs: There were no vitals taken for this visit.  Physical Exam Constitutional:      Appearance: Normal appearance.  HENT:     Head: Normocephalic and atraumatic.  Eyes:     General: No scleral icterus. Cardiovascular:     Rate and Rhythm: Normal rate.  Pulmonary:     Effort: Pulmonary effort is normal.  Abdominal:     General: Abdomen is flat.     Palpations: Abdomen is soft.  Musculoskeletal:        General: No swelling.  Skin:    General: Skin is warm and dry.  Neurological:     Mental Status: She is alert and oriented to person, place, and time.  Psychiatric:        Mood and Affect: Mood normal.        Behavior: Behavior normal.     Imaging: US Venous Img Lower Bilateral (DVT)  Result Date:  05/03/2021 CLINICAL DATA:  75 year old female with a history of massive right lower extremity DVT extending into the IVC to the level of the renal veins. She underwent percutaneous thrombectomy in May of 2021. She presents for follow-up evaluation. EXAM: RIGHT LOWER EXTREMITY VENOUS DOPPLER ULTRASOUND TECHNIQUE: Gray-scale sonography with  graded compression, as well as color Doppler and duplex ultrasound were performed to evaluate the lower extremity deep venous systems from the level of the common femoral vein and including the common femoral, femoral, profunda femoral, popliteal and calf veins including the posterior tibial, peroneal and gastrocnemius veins when visible. The superficial great saphenous vein was also interrogated. Spectral Doppler was utilized to evaluate flow at rest and with distal augmentation maneuvers in the common femoral, femoral and popliteal veins. COMPARISON:  Prior ultrasound 05/25/2020 FINDINGS: Contralateral Common Femoral Vein: Respiratory phasicity is normal and symmetric with the symptomatic side. No evidence of thrombus. Normal compressibility. Common Femoral Vein: Mild residual eccentric echogenic wall thickening consistent with sequelae of chronic DVT. Overall, the changes are slightly improved compared to sonographic imaging from last year. No evidence of new occlusive thrombus. Excellent color flow on color Doppler imaging with normal response to augmentation. Saphenofemoral Junction: Minimal residual eccentric wall thickening, improving. No acute thrombus. Profunda Femoral Vein: No evidence of thrombus. Normal compressibility and flow on color Doppler imaging. Femoral Vein: No evidence of thrombus. Normal compressibility, respiratory phasicity and response to augmentation. Popliteal Vein: No evidence of thrombus. Normal compressibility, respiratory phasicity and response to augmentation. Calf Veins: No evidence of thrombus. Normal compressibility and flow on color Doppler  imaging. Superficial Great Saphenous Vein: No evidence of thrombus. Normal compressibility. Venous Reflux:  None. Other Findings:  None. IMPRESSION: Improving minimal sequelae of chronic DVT in the left common femoral and proximal femoral veins. No evidence of acute DVT. Electronically Signed   By: Jacqulynn Cadet M.D.   On: 05/03/2021 11:11   IR Radiologist Eval & Mgmt  Result Date: 05/03/2021 Please refer to notes tab for details about interventional procedure. (Op Note)   Labs:  CBC: No results for input(s): WBC, HGB, HCT, PLT in the last 8760 hours.  COAGS: No results for input(s): INR, APTT in the last 8760 hours.  BMP: No results for input(s): NA, K, CL, CO2, GLUCOSE, BUN, CALCIUM, CREATININE, GFRNONAA, GFRAA in the last 8760 hours.  Invalid input(s): CMP  LIVER FUNCTION TESTS: No results for input(s): BILITOT, AST, ALT, ALKPHOS, PROT, ALBUMIN in the last 8760 hours.  TUMOR MARKERS: No results for input(s): AFPTM, CEA, CA199, CHROMGRNA in the last 8760 hours.  Assessment and Plan:  75 year old female overall doing exceptionally well 16 months status post ileocaval thrombectomy for large-volume deep venous thrombosis.   Duplex venous ultrasound today demonstrates no evidence of DVT in the left lower extremity.  Stable and improving recurrent/residual DVT in the right femoral vein.    She has returned nearly to her baseline in terms of activity level and is extremely pleased with her results overall.   1.)  Continue anticoagulation. 2.)  Follow-up bilateral lower extremity DVT ultrasound and clinic visit in 1 year.    Electronically Signed: Criselda Peaches 05/03/2021, 4:05 PM   I spent a total of 25 Minutes in face to face in clinical consultation, greater than 50% of which was counseling/coordinating care for RLE ileocaval DVT

## 2021-06-15 DIAGNOSIS — E7800 Pure hypercholesterolemia, unspecified: Secondary | ICD-10-CM | POA: Diagnosis not present

## 2021-06-15 DIAGNOSIS — J439 Emphysema, unspecified: Secondary | ICD-10-CM | POA: Diagnosis not present

## 2021-06-15 DIAGNOSIS — I10 Essential (primary) hypertension: Secondary | ICD-10-CM | POA: Diagnosis not present

## 2021-07-20 DIAGNOSIS — I10 Essential (primary) hypertension: Secondary | ICD-10-CM | POA: Diagnosis not present

## 2021-07-20 DIAGNOSIS — E7800 Pure hypercholesterolemia, unspecified: Secondary | ICD-10-CM | POA: Diagnosis not present

## 2021-07-20 DIAGNOSIS — J439 Emphysema, unspecified: Secondary | ICD-10-CM | POA: Diagnosis not present

## 2021-08-24 DIAGNOSIS — I10 Essential (primary) hypertension: Secondary | ICD-10-CM | POA: Diagnosis not present

## 2021-08-24 DIAGNOSIS — D6869 Other thrombophilia: Secondary | ICD-10-CM | POA: Diagnosis not present

## 2021-08-24 DIAGNOSIS — Z23 Encounter for immunization: Secondary | ICD-10-CM | POA: Diagnosis not present

## 2021-08-24 DIAGNOSIS — J309 Allergic rhinitis, unspecified: Secondary | ICD-10-CM | POA: Diagnosis not present

## 2021-08-24 DIAGNOSIS — E7800 Pure hypercholesterolemia, unspecified: Secondary | ICD-10-CM | POA: Diagnosis not present

## 2021-08-24 DIAGNOSIS — I739 Peripheral vascular disease, unspecified: Secondary | ICD-10-CM | POA: Diagnosis not present

## 2021-09-13 DIAGNOSIS — E7800 Pure hypercholesterolemia, unspecified: Secondary | ICD-10-CM | POA: Diagnosis not present

## 2021-09-13 DIAGNOSIS — I10 Essential (primary) hypertension: Secondary | ICD-10-CM | POA: Diagnosis not present

## 2021-10-03 DIAGNOSIS — I10 Essential (primary) hypertension: Secondary | ICD-10-CM | POA: Diagnosis not present

## 2021-10-29 IMAGING — MG DIGITAL SCREENING BREAST BILAT IMPLANT W/ TOMO W/ CAD
8 of 12 series · 8 of 28 positions shown · non-contrast
Comparison: Previous exam(s).

CLINICAL DATA: Screening.

EXAM:
DIGITAL SCREENING BILATERAL MAMMOGRAM WITH IMPLANTS, CAD AND TOMO
The patient has prepectoral implants. Standard and implant displaced
views were performed.

[L CC]
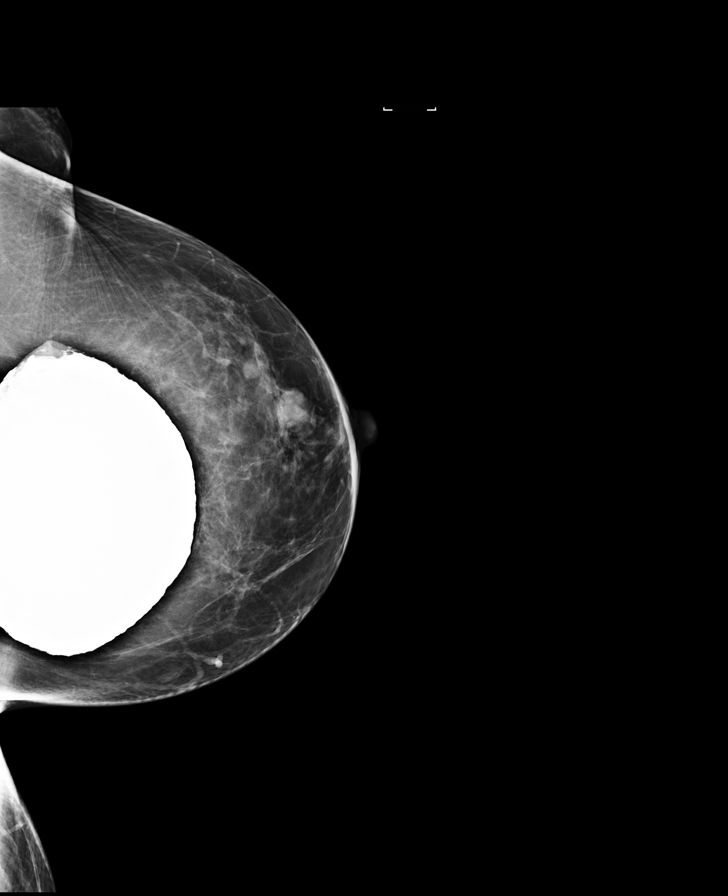

[R CC]
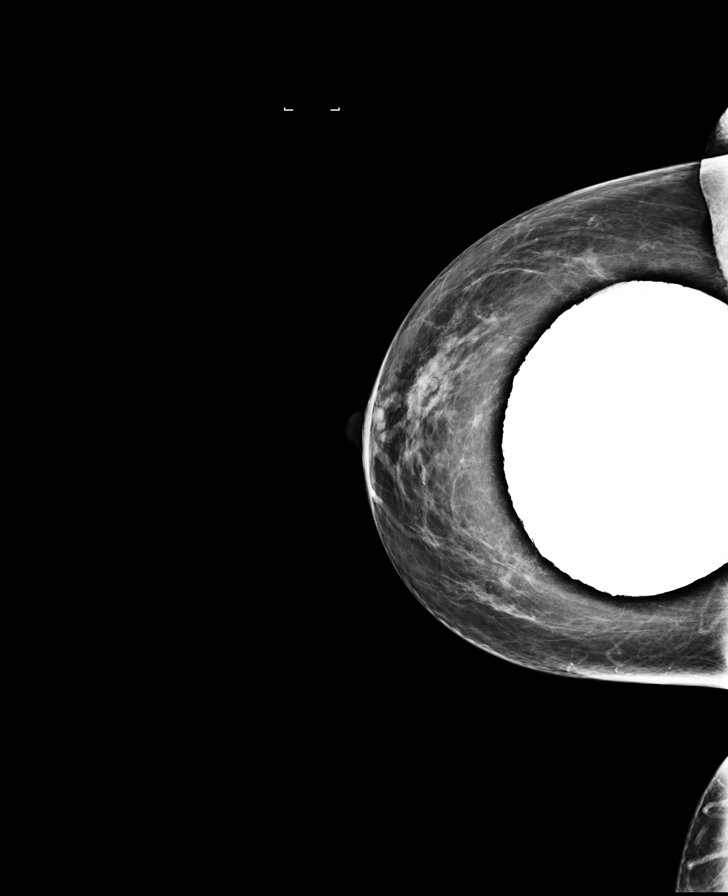

[L MLO]
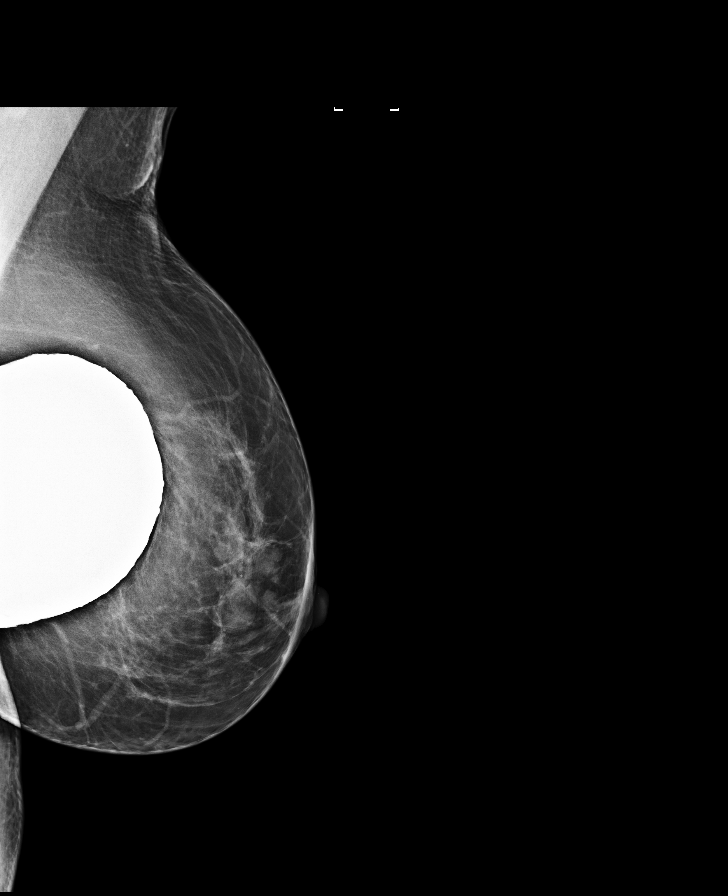

[R MLO]
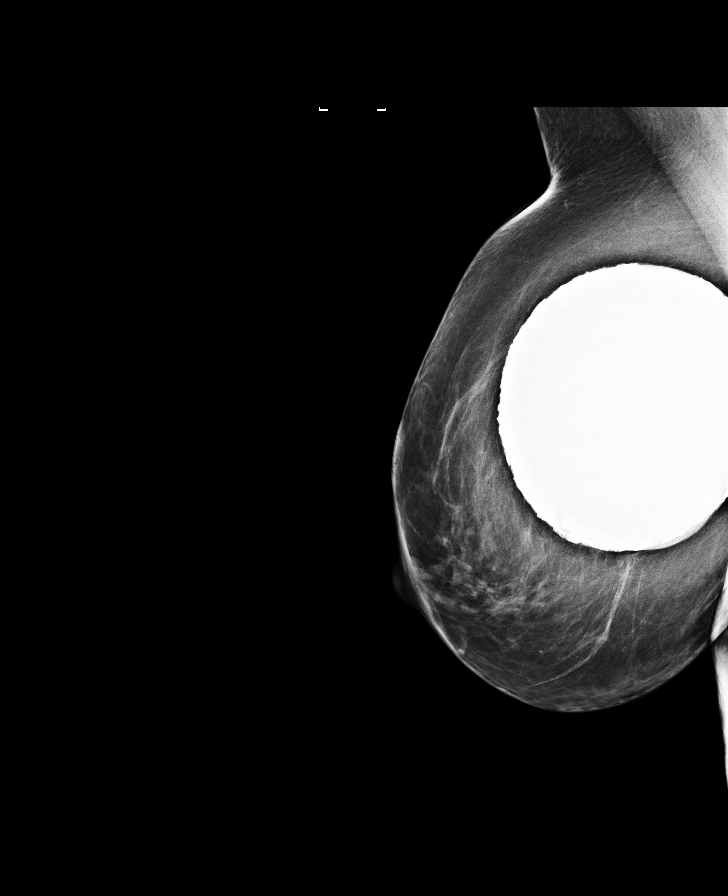

[L MLO synth-2D]
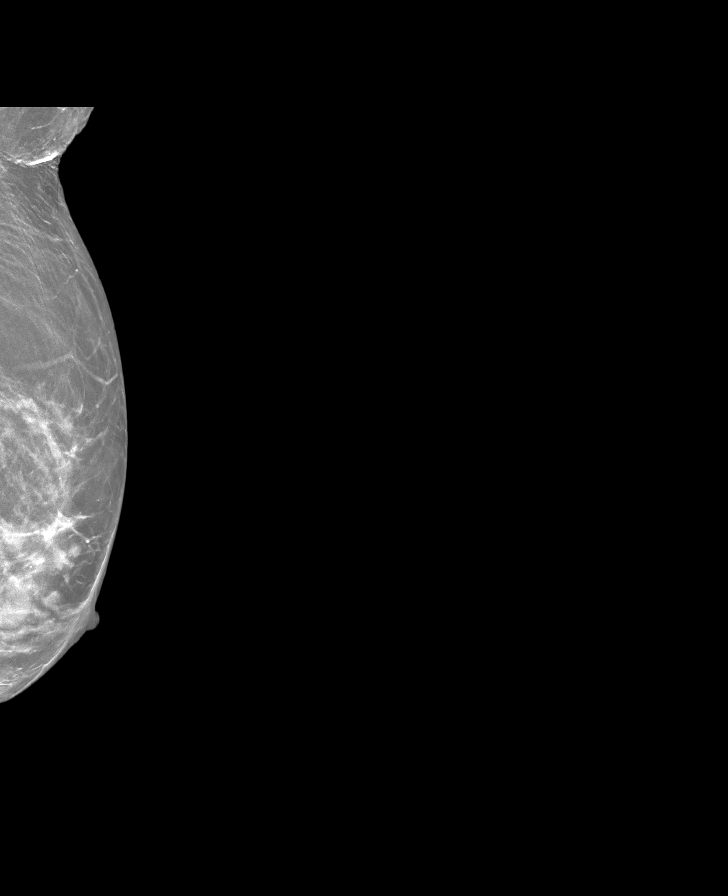

[L CC synth-2D]
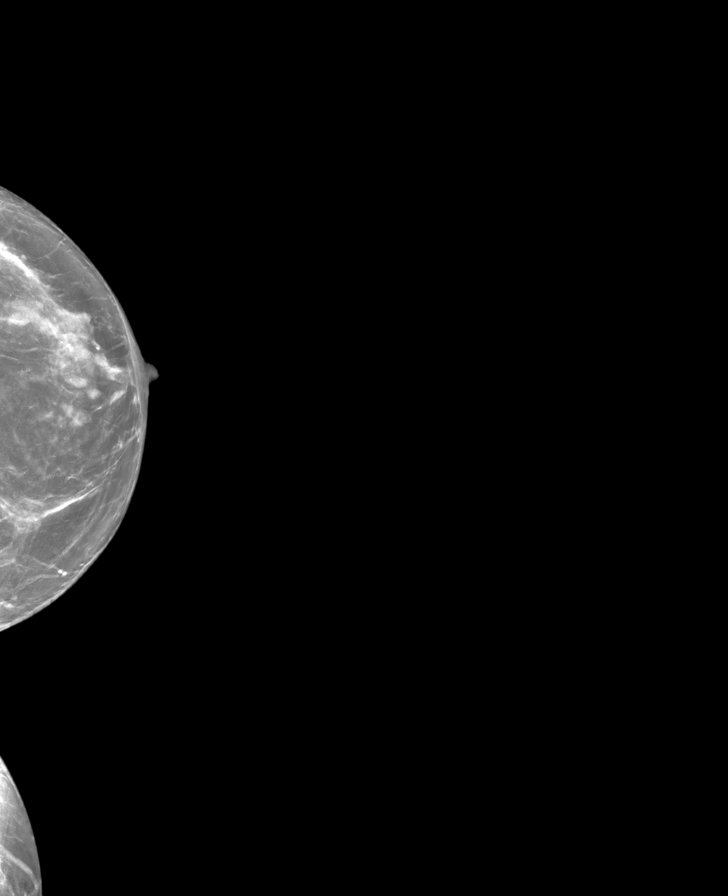

[R CC synth-2D]
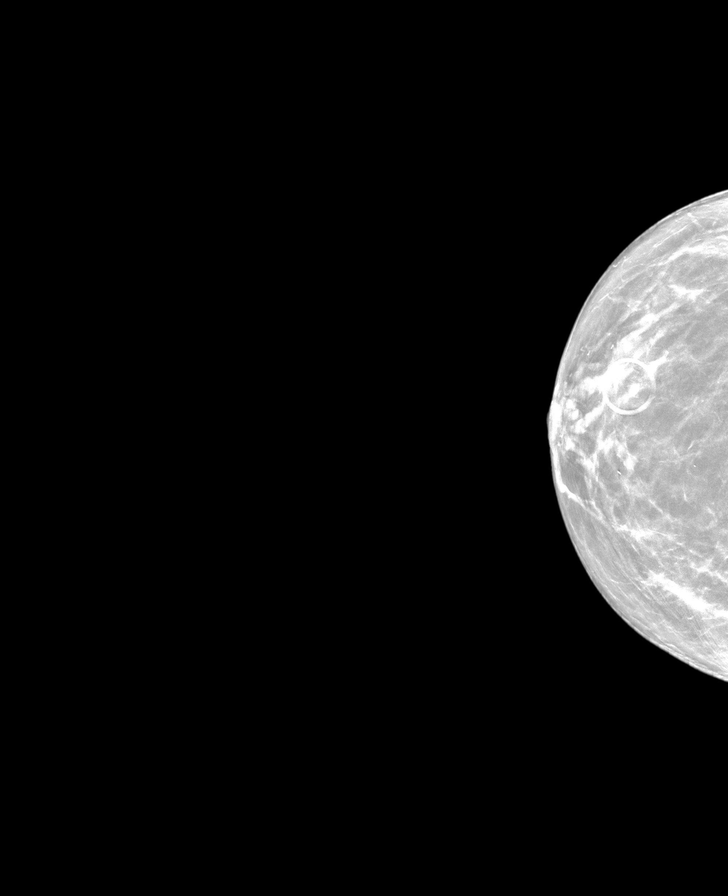

[R MLO synth-2D]
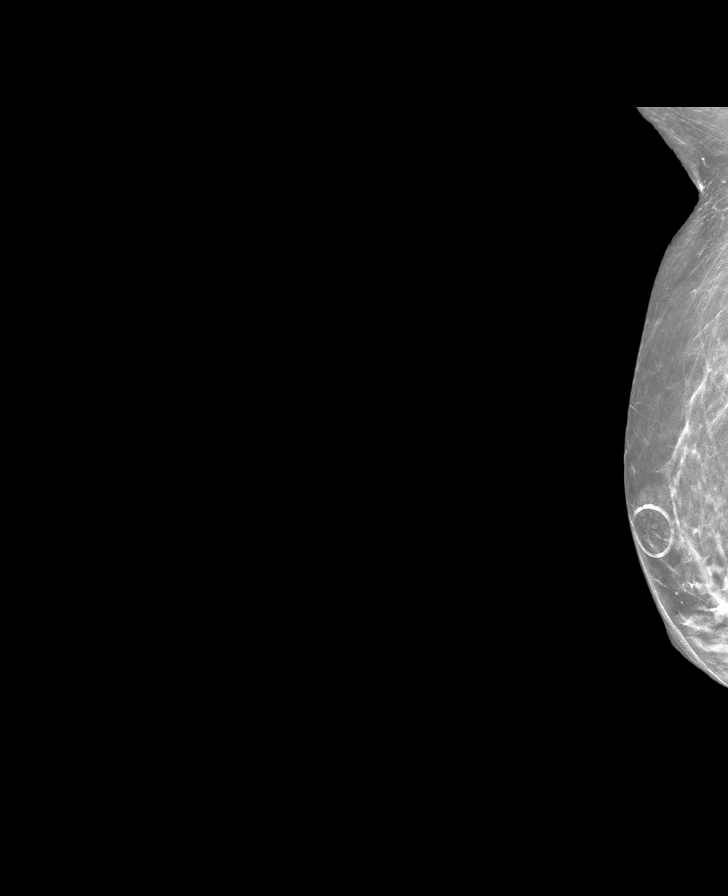

[8 of 28 positions shown; findings below may reference images not displayed]

ACR Breast Density Category b: There are scattered areas of
fibroglandular density.
FINDINGS: There are no findings suspicious for malignancy. Images were
processed with CAD.
IMPRESSION: No mammographic evidence of malignancy. A result letter of this
screening mammogram will be mailed directly to the patient.

RECOMMENDATION:
Screening mammogram in one year. (Code:15-K-3LP)

BI-RADS CATEGORY  1:  Negative.

## 2021-12-09 DIAGNOSIS — E7800 Pure hypercholesterolemia, unspecified: Secondary | ICD-10-CM | POA: Diagnosis not present

## 2021-12-09 DIAGNOSIS — I10 Essential (primary) hypertension: Secondary | ICD-10-CM | POA: Diagnosis not present

## 2022-01-24 DIAGNOSIS — I10 Essential (primary) hypertension: Secondary | ICD-10-CM | POA: Diagnosis not present

## 2022-01-24 DIAGNOSIS — J439 Emphysema, unspecified: Secondary | ICD-10-CM | POA: Diagnosis not present

## 2022-01-24 DIAGNOSIS — Z86718 Personal history of other venous thrombosis and embolism: Secondary | ICD-10-CM | POA: Diagnosis not present

## 2022-01-24 DIAGNOSIS — Z86711 Personal history of pulmonary embolism: Secondary | ICD-10-CM | POA: Diagnosis not present

## 2022-01-24 DIAGNOSIS — Z0000 Encounter for general adult medical examination without abnormal findings: Secondary | ICD-10-CM | POA: Diagnosis not present

## 2022-01-24 DIAGNOSIS — E7800 Pure hypercholesterolemia, unspecified: Secondary | ICD-10-CM | POA: Diagnosis not present

## 2022-01-24 DIAGNOSIS — I739 Peripheral vascular disease, unspecified: Secondary | ICD-10-CM | POA: Diagnosis not present

## 2022-01-24 DIAGNOSIS — D6851 Activated protein C resistance: Secondary | ICD-10-CM | POA: Diagnosis not present

## 2022-01-24 DIAGNOSIS — I700 Atherosclerosis of aorta: Secondary | ICD-10-CM | POA: Diagnosis not present

## 2022-01-24 DIAGNOSIS — J309 Allergic rhinitis, unspecified: Secondary | ICD-10-CM | POA: Diagnosis not present

## 2022-01-24 DIAGNOSIS — Z1331 Encounter for screening for depression: Secondary | ICD-10-CM | POA: Diagnosis not present

## 2022-02-09 DIAGNOSIS — I10 Essential (primary) hypertension: Secondary | ICD-10-CM | POA: Diagnosis not present

## 2022-02-09 DIAGNOSIS — E7800 Pure hypercholesterolemia, unspecified: Secondary | ICD-10-CM | POA: Diagnosis not present

## 2022-03-13 DIAGNOSIS — I10 Essential (primary) hypertension: Secondary | ICD-10-CM | POA: Diagnosis not present

## 2022-03-13 DIAGNOSIS — E7800 Pure hypercholesterolemia, unspecified: Secondary | ICD-10-CM | POA: Diagnosis not present

## 2022-03-24 DIAGNOSIS — Z85828 Personal history of other malignant neoplasm of skin: Secondary | ICD-10-CM | POA: Diagnosis not present

## 2022-03-24 DIAGNOSIS — D225 Melanocytic nevi of trunk: Secondary | ICD-10-CM | POA: Diagnosis not present

## 2022-03-24 DIAGNOSIS — D2261 Melanocytic nevi of right upper limb, including shoulder: Secondary | ICD-10-CM | POA: Diagnosis not present

## 2022-03-24 DIAGNOSIS — D2262 Melanocytic nevi of left upper limb, including shoulder: Secondary | ICD-10-CM | POA: Diagnosis not present

## 2022-04-11 DIAGNOSIS — J439 Emphysema, unspecified: Secondary | ICD-10-CM | POA: Diagnosis not present

## 2022-04-11 DIAGNOSIS — I10 Essential (primary) hypertension: Secondary | ICD-10-CM | POA: Diagnosis not present

## 2022-04-11 DIAGNOSIS — E7800 Pure hypercholesterolemia, unspecified: Secondary | ICD-10-CM | POA: Diagnosis not present

## 2022-04-13 ENCOUNTER — Other Ambulatory Visit: Payer: Self-pay | Admitting: Interventional Radiology

## 2022-04-13 DIAGNOSIS — I82401 Acute embolism and thrombosis of unspecified deep veins of right lower extremity: Secondary | ICD-10-CM

## 2022-04-18 DIAGNOSIS — Z23 Encounter for immunization: Secondary | ICD-10-CM | POA: Diagnosis not present

## 2022-05-05 ENCOUNTER — Ambulatory Visit
Admission: RE | Admit: 2022-05-05 | Discharge: 2022-05-05 | Disposition: A | Discharge status: Home / Self Care | Payer: Medicare HMO | Source: Ambulatory Visit | Attending: Interventional Radiology | Admitting: Interventional Radiology

## 2022-05-05 ENCOUNTER — Encounter: Payer: Self-pay | Admitting: Lab

## 2022-05-05 ENCOUNTER — Ambulatory Visit
Admission: RE | Admit: 2022-05-05 | Discharge: 2022-05-05 | Disposition: A | Discharge status: Home / Self Care | Payer: No Typology Code available for payment source | Source: Ambulatory Visit | Attending: Interventional Radiology | Admitting: Interventional Radiology

## 2022-05-05 DIAGNOSIS — I82401 Acute embolism and thrombosis of unspecified deep veins of right lower extremity: Secondary | ICD-10-CM

## 2022-05-05 DIAGNOSIS — Z86718 Personal history of other venous thrombosis and embolism: Secondary | ICD-10-CM | POA: Diagnosis not present

## 2022-05-05 HISTORY — PX: IR RADIOLOGIST EVAL & MGMT: IMG5224

## 2022-05-05 NOTE — Progress Notes (Signed)
Chief Complaint: Patient was seen in consultation today for iliocaval and extensive RLE DVT at the request of Ardis Lawley K  Referring Physician(s): Aline August, MD  History of Present Illness: Belinda Perry is a 76 y.o. female who I initially saw as an inpatient at [facility] on 12/04/2019.  She had presented with severe right lower extremity pain and swelling and was diagnosed with extensive right lower extremity DVT extending throughout the iliac veins and into the IVC to the level of the renal veins.  She underwent an extensive mechanical thrombectomy of her ileocaval and right lower extremity DVT and was successfully discharged home on 12/05/2019.   She presents today for 28 month follow-up evaluation.     She reports that she currently feels great and remains at her normal level of activity similar to our last visit.  She has no new clinical issues. She denies current chest pain, shortness of breath, fever, chills or other systemic symptoms.  Past Medical History:  Diagnosis Date   Allergic reaction to inhaled pollen    Allergy    Hyperlipidemia    Hypertension    Osteoarthritis, generalized    Other osteoporosis    Skin cancer, basal cell 1994   face    Past Surgical History:  Procedure Laterality Date   AUGMENTATION MAMMAPLASTY Bilateral    BREAST ENHANCEMENT SURGERY Bilateral 1973   COLONOSCOPY     FOOT SURGERY Bilateral    right 8/12, 11/12 for left. Dr Milinda Pointer   IR PTA VENOUS EXCEPT DIALYSIS CIRCUIT  12/04/2019   IR RADIOLOGIST EVAL & MGMT  01/27/2020   IR RADIOLOGIST EVAL & MGMT  05/25/2020   IR RADIOLOGIST EVAL & MGMT  05/03/2021   IR THROMBECT VENO MECH MOD SED  12/04/2019   IR US GUIDE VASC ACCESS LEFT  12/04/2019   IR US GUIDE VASC ACCESS RIGHT  12/04/2019   IR VENO/EXT/BI  12/04/2019   IR VENO/EXT/BI  12/04/2019   IR VENOCAVAGRAM IVC  12/04/2019   POLYPECTOMY     TONSILLECTOMY N/A 1970   TUBAL LIGATION      Allergies: Codeine  Medications: Prior  to Admission medications   Medication Sig Start Date End Date Taking? Authorizing Provider  BIOTIN PO Take 1 tablet by mouth.    [provider]  cetirizine (ZYRTEC) 10 MG tablet Take 10 mg by mouth daily as needed for allergies.    [provider]  enoxaparin (LOVENOX) 60 MG/0.6ML injection Inject 0.6 mLs (60 mg total) into the skin every 12 (twelve) hours. 12/05/19 01/16/20  Aline August, MD  fluticasone (FLONASE) 50 MCG/ACT nasal spray Place 2 sprays into both nostrils daily as needed for allergies or rhinitis.    [provider]  Multiple Vitamins-Minerals (MULTIVITAL) CHEW Chew 1 tablet by mouth daily.    [provider]  potassium chloride (KLOR-CON) 10 MEQ tablet Take 10 mEq by mouth daily. 12/02/19   [provider]  Probiotic Product (PROBIOTIC DAILY PO) Take 1 tablet by mouth.    [provider]  simvastatin (ZOCOR) 40 MG tablet TAKE 1 TABLET (40 MG TOTAL) BY MOUTH AT BEDTIME. Patient taking differently: Take 40 mg by mouth at bedtime.  11/30/16   Venia Carbon, MD     Family History  Problem Relation Age of Onset   Heart disease Mother    Heart disease Father    Diabetes Father    Hypertension Father    Stroke Sister    Heart disease Brother  Cancer Maternal Grandfather        colon   Colon cancer Maternal Grandfather 52   Cancer Other        various cancers   Colon cancer Paternal Uncle 64   Colon polyps Neg Hx    Esophageal cancer Neg Hx    Rectal cancer Neg Hx    Stomach cancer Neg Hx     Social History   Socioeconomic History   Marital status: Married    Spouse name: Not on file   Number of children: 1   Years of education: Not on file   Highest education level: Not on file  Occupational History   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  Tobacco Use   Smoking status: Former    Types: Cigarettes   Smokeless tobacco: Never  Substance and Sexual Activity   Alcohol use: No   Drug  use: No   Sexual activity: Not on file  Other Topics Concern   Not on file  Social History Narrative   1 daughter      No living will   Husband should be health care POA   Would accept resuscitation but no prolonged machines   No feeding tubes if cognitively unaware   Social Determinants of Health   Financial Resource Strain: Not on file  Food Insecurity: Not on file  Transportation Needs: Not on file  Physical Activity: Not on file  Stress: Not on file  Social Connections: Not on file   Review of Systems: A 12 point ROS discussed and pertinent positives are indicated in the HPI above.  All other systems are negative.  Review of Systems  Vital Signs: There were no vitals taken for this visit.  Advance Care Plan: The advanced care plan/surrogate decision maker was discussed at the time of visit and the patient did not wish to discuss or was not able to name a surrogate decision maker or provide an advance care plan.    Physical Exam Constitutional:      General: She is not in acute distress.    Appearance: Normal appearance.  HENT:     Head: Normocephalic and atraumatic.  Eyes:     General: No scleral icterus. Cardiovascular:     Rate and Rhythm: Normal rate.  Pulmonary:     Effort: Pulmonary effort is normal.  Abdominal:     General: Abdomen is flat. There is no distension.     Palpations: Abdomen is soft.  Musculoskeletal:        General: No swelling.  Skin:    General: Skin is warm and dry.  Neurological:     Mental Status: She is alert and oriented to person, place, and time.  Psychiatric:        Mood and Affect: Mood normal.        Behavior: Behavior normal.       Imaging: No results found.  Labs:  CBC: No results for input(s): "WBC", "HGB", "HCT", "PLT" in the last 8760 hours.  COAGS: No results for input(s): "INR", "APTT" in the last 8760 hours.  BMP: No results for input(s): "NA", "K", "CL", "CO2", "GLUCOSE", "BUN", "CALCIUM",  "CREATININE", "GFRNONAA", "GFRAA" in the last 8760 hours.  Invalid input(s): "CMP"  LIVER FUNCTION TESTS: No results for input(s): "BILITOT", "AST", "ALT", "ALKPHOS", "PROT", "ALBUMIN" in the last 8760 hours.  TUMOR MARKERS: No results for input(s): "AFPTM", "CEA", "CA199", "CHROMGRNA" in the last 8760 hours.  Assessment and Plan:  76 year old female overall doing  exceptionally well 16 months status post ileocaval thrombectomy for large-volume deep venous thrombosis.   Duplex venous ultrasound today demonstrates no evidence of DVT in either lower extremity.  The previously identified chronic changes in the right lower extremity are now resolved. She has returned nearly to her baseline in terms of activity level and is extremely pleased with her results overall.   1.)  Continue anticoagulation. 2.)  Follow-up bilateral lower extremity DVT ultrasound and clinic visit in 1 year.  Future follow-ups to be performed at [facility] as she lives in [place].    Electronically Signed: Criselda Peaches 05/05/2022, 2:47 PM   I spent a total of  15 Minutes in face to face in clinical consultation, greater than 50% of which was counseling/coordinating care for history of iliocaval DVT

## 2022-06-07 DIAGNOSIS — E7800 Pure hypercholesterolemia, unspecified: Secondary | ICD-10-CM | POA: Diagnosis not present

## 2022-06-07 DIAGNOSIS — J439 Emphysema, unspecified: Secondary | ICD-10-CM | POA: Diagnosis not present

## 2022-06-07 DIAGNOSIS — I10 Essential (primary) hypertension: Secondary | ICD-10-CM | POA: Diagnosis not present

## 2022-07-03 ENCOUNTER — Other Ambulatory Visit: Payer: Self-pay | Admitting: Physician Assistant

## 2022-07-03 ENCOUNTER — Ambulatory Visit
Admission: RE | Admit: 2022-07-03 | Discharge: 2022-07-03 | Disposition: A | Discharge status: Home / Self Care | Payer: No Typology Code available for payment source | Source: Ambulatory Visit | Attending: Physician Assistant | Admitting: Physician Assistant

## 2022-07-03 DIAGNOSIS — J9601 Acute respiratory failure with hypoxia: Secondary | ICD-10-CM

## 2022-07-03 DIAGNOSIS — R059 Cough, unspecified: Secondary | ICD-10-CM | POA: Diagnosis not present

## 2022-07-03 DIAGNOSIS — R0602 Shortness of breath: Secondary | ICD-10-CM | POA: Diagnosis not present

## 2022-07-03 DIAGNOSIS — R0902 Hypoxemia: Secondary | ICD-10-CM | POA: Diagnosis not present

## 2022-07-03 DIAGNOSIS — J441 Chronic obstructive pulmonary disease with (acute) exacerbation: Secondary | ICD-10-CM | POA: Diagnosis not present

## 2022-07-10 DIAGNOSIS — R053 Chronic cough: Secondary | ICD-10-CM | POA: Diagnosis not present

## 2022-08-07 DIAGNOSIS — I10 Essential (primary) hypertension: Secondary | ICD-10-CM | POA: Diagnosis not present

## 2022-08-07 DIAGNOSIS — R053 Chronic cough: Secondary | ICD-10-CM | POA: Diagnosis not present

## 2022-09-06 DIAGNOSIS — J439 Emphysema, unspecified: Secondary | ICD-10-CM | POA: Diagnosis not present

## 2022-12-14 DIAGNOSIS — H2513 Age-related nuclear cataract, bilateral: Secondary | ICD-10-CM | POA: Diagnosis not present

## 2022-12-14 DIAGNOSIS — D3131 Benign neoplasm of right choroid: Secondary | ICD-10-CM | POA: Diagnosis not present

## 2022-12-14 DIAGNOSIS — Z0100 Encounter for examination of eyes and vision without abnormal findings: Secondary | ICD-10-CM | POA: Diagnosis not present

## 2022-12-14 DIAGNOSIS — H524 Presbyopia: Secondary | ICD-10-CM | POA: Diagnosis not present

## 2023-04-30 DIAGNOSIS — Z0000 Encounter for general adult medical examination without abnormal findings: Secondary | ICD-10-CM | POA: Diagnosis not present

## 2023-04-30 DIAGNOSIS — I739 Peripheral vascular disease, unspecified: Secondary | ICD-10-CM | POA: Diagnosis not present

## 2023-04-30 DIAGNOSIS — M7989 Other specified soft tissue disorders: Secondary | ICD-10-CM | POA: Diagnosis not present

## 2023-04-30 DIAGNOSIS — D6851 Activated protein C resistance: Secondary | ICD-10-CM | POA: Diagnosis not present

## 2023-04-30 DIAGNOSIS — M85851 Other specified disorders of bone density and structure, right thigh: Secondary | ICD-10-CM | POA: Diagnosis not present

## 2023-04-30 DIAGNOSIS — E7800 Pure hypercholesterolemia, unspecified: Secondary | ICD-10-CM | POA: Diagnosis not present

## 2023-04-30 DIAGNOSIS — I700 Atherosclerosis of aorta: Secondary | ICD-10-CM | POA: Diagnosis not present

## 2023-04-30 DIAGNOSIS — M85852 Other specified disorders of bone density and structure, left thigh: Secondary | ICD-10-CM | POA: Diagnosis not present

## 2023-04-30 DIAGNOSIS — I10 Essential (primary) hypertension: Secondary | ICD-10-CM | POA: Diagnosis not present

## 2023-04-30 DIAGNOSIS — J439 Emphysema, unspecified: Secondary | ICD-10-CM | POA: Diagnosis not present

## 2023-05-02 ENCOUNTER — Other Ambulatory Visit: Payer: Self-pay | Admitting: Physician Assistant

## 2023-05-02 DIAGNOSIS — Z23 Encounter for immunization: Secondary | ICD-10-CM | POA: Diagnosis not present

## 2023-05-02 DIAGNOSIS — M7989 Other specified soft tissue disorders: Secondary | ICD-10-CM | POA: Diagnosis not present

## 2023-05-02 DIAGNOSIS — M85851 Other specified disorders of bone density and structure, right thigh: Secondary | ICD-10-CM

## 2023-10-23 DIAGNOSIS — E7800 Pure hypercholesterolemia, unspecified: Secondary | ICD-10-CM | POA: Diagnosis not present

## 2023-10-23 DIAGNOSIS — I10 Essential (primary) hypertension: Secondary | ICD-10-CM | POA: Diagnosis not present

## 2023-10-23 DIAGNOSIS — D6851 Activated protein C resistance: Secondary | ICD-10-CM | POA: Diagnosis not present

## 2023-10-23 DIAGNOSIS — J439 Emphysema, unspecified: Secondary | ICD-10-CM | POA: Diagnosis not present

## 2023-10-23 DIAGNOSIS — I739 Peripheral vascular disease, unspecified: Secondary | ICD-10-CM | POA: Diagnosis not present

## 2023-10-23 DIAGNOSIS — Z86718 Personal history of other venous thrombosis and embolism: Secondary | ICD-10-CM | POA: Diagnosis not present

## 2023-12-17 ENCOUNTER — Ambulatory Visit
Admission: RE | Admit: 2023-12-17 | Discharge: 2023-12-17 | Disposition: A | Discharge status: Home / Self Care | Payer: No Typology Code available for payment source | Source: Ambulatory Visit | Attending: Physician Assistant | Admitting: Physician Assistant

## 2023-12-17 DIAGNOSIS — M85851 Other specified disorders of bone density and structure, right thigh: Secondary | ICD-10-CM

## 2023-12-17 DIAGNOSIS — M8588 Other specified disorders of bone density and structure, other site: Secondary | ICD-10-CM | POA: Diagnosis not present

## 2023-12-17 DIAGNOSIS — N958 Other specified menopausal and perimenopausal disorders: Secondary | ICD-10-CM | POA: Diagnosis not present

## 2024-02-06 DIAGNOSIS — D3131 Benign neoplasm of right choroid: Secondary | ICD-10-CM | POA: Diagnosis not present

## 2024-02-06 DIAGNOSIS — H35363 Drusen (degenerative) of macula, bilateral: Secondary | ICD-10-CM | POA: Diagnosis not present

## 2024-02-06 DIAGNOSIS — H2513 Age-related nuclear cataract, bilateral: Secondary | ICD-10-CM | POA: Diagnosis not present

## 2024-02-27 DIAGNOSIS — H18413 Arcus senilis, bilateral: Secondary | ICD-10-CM | POA: Diagnosis not present

## 2024-02-27 DIAGNOSIS — H25013 Cortical age-related cataract, bilateral: Secondary | ICD-10-CM | POA: Diagnosis not present

## 2024-02-27 DIAGNOSIS — H2513 Age-related nuclear cataract, bilateral: Secondary | ICD-10-CM | POA: Diagnosis not present

## 2024-02-27 DIAGNOSIS — H47323 Drusen of optic disc, bilateral: Secondary | ICD-10-CM | POA: Diagnosis not present

## 2024-02-27 DIAGNOSIS — H2511 Age-related nuclear cataract, right eye: Secondary | ICD-10-CM | POA: Diagnosis not present

## 2024-02-27 DIAGNOSIS — D3131 Benign neoplasm of right choroid: Secondary | ICD-10-CM | POA: Diagnosis not present

## 2024-03-18 DIAGNOSIS — H2512 Age-related nuclear cataract, left eye: Secondary | ICD-10-CM | POA: Diagnosis not present

## 2024-03-18 DIAGNOSIS — H268 Other specified cataract: Secondary | ICD-10-CM | POA: Diagnosis not present

## 2024-03-18 DIAGNOSIS — H25012 Cortical age-related cataract, left eye: Secondary | ICD-10-CM | POA: Diagnosis not present

## 2024-03-18 DIAGNOSIS — H2511 Age-related nuclear cataract, right eye: Secondary | ICD-10-CM | POA: Diagnosis not present

## 2024-03-18 DIAGNOSIS — I10 Essential (primary) hypertension: Secondary | ICD-10-CM | POA: Diagnosis not present

## 2024-03-18 DIAGNOSIS — J45909 Unspecified asthma, uncomplicated: Secondary | ICD-10-CM | POA: Diagnosis not present

## 2024-03-25 DIAGNOSIS — H2512 Age-related nuclear cataract, left eye: Secondary | ICD-10-CM | POA: Diagnosis not present

## 2024-03-25 DIAGNOSIS — H268 Other specified cataract: Secondary | ICD-10-CM | POA: Diagnosis not present

## 2024-03-25 DIAGNOSIS — J45909 Unspecified asthma, uncomplicated: Secondary | ICD-10-CM | POA: Diagnosis not present

## 2024-03-25 DIAGNOSIS — I10 Essential (primary) hypertension: Secondary | ICD-10-CM | POA: Diagnosis not present

## 2024-04-03 DIAGNOSIS — H2511 Age-related nuclear cataract, right eye: Secondary | ICD-10-CM | POA: Diagnosis not present

## 2024-04-03 DIAGNOSIS — H2512 Age-related nuclear cataract, left eye: Secondary | ICD-10-CM | POA: Diagnosis not present

## 2024-05-01 DIAGNOSIS — J439 Emphysema, unspecified: Secondary | ICD-10-CM | POA: Diagnosis not present

## 2024-05-01 DIAGNOSIS — E7800 Pure hypercholesterolemia, unspecified: Secondary | ICD-10-CM | POA: Diagnosis not present

## 2024-05-01 DIAGNOSIS — I10 Essential (primary) hypertension: Secondary | ICD-10-CM | POA: Diagnosis not present

## 2024-05-01 DIAGNOSIS — Z86718 Personal history of other venous thrombosis and embolism: Secondary | ICD-10-CM | POA: Diagnosis not present

## 2024-05-01 DIAGNOSIS — I739 Peripheral vascular disease, unspecified: Secondary | ICD-10-CM | POA: Diagnosis not present

## 2024-05-01 DIAGNOSIS — Z86711 Personal history of pulmonary embolism: Secondary | ICD-10-CM | POA: Diagnosis not present

## 2024-05-01 DIAGNOSIS — I700 Atherosclerosis of aorta: Secondary | ICD-10-CM | POA: Diagnosis not present

## 2024-05-01 DIAGNOSIS — Z0000 Encounter for general adult medical examination without abnormal findings: Secondary | ICD-10-CM | POA: Diagnosis not present

## 2024-05-01 DIAGNOSIS — Z1331 Encounter for screening for depression: Secondary | ICD-10-CM | POA: Diagnosis not present

## 2024-05-01 DIAGNOSIS — E041 Nontoxic single thyroid nodule: Secondary | ICD-10-CM | POA: Diagnosis not present

## 2024-05-01 DIAGNOSIS — E559 Vitamin D deficiency, unspecified: Secondary | ICD-10-CM | POA: Diagnosis not present

## 2024-05-01 DIAGNOSIS — D6851 Activated protein C resistance: Secondary | ICD-10-CM | POA: Diagnosis not present

## 2024-05-05 DIAGNOSIS — J4520 Mild intermittent asthma, uncomplicated: Secondary | ICD-10-CM | POA: Diagnosis not present

## 2024-05-05 DIAGNOSIS — I700 Atherosclerosis of aorta: Secondary | ICD-10-CM | POA: Diagnosis not present

## 2024-05-05 DIAGNOSIS — Z87891 Personal history of nicotine dependence: Secondary | ICD-10-CM | POA: Diagnosis not present

## 2024-05-05 DIAGNOSIS — N3946 Mixed incontinence: Secondary | ICD-10-CM | POA: Diagnosis not present

## 2024-05-05 DIAGNOSIS — J439 Emphysema, unspecified: Secondary | ICD-10-CM | POA: Diagnosis not present

## 2024-05-05 DIAGNOSIS — M8580 Other specified disorders of bone density and structure, unspecified site: Secondary | ICD-10-CM | POA: Diagnosis not present

## 2024-05-05 DIAGNOSIS — Z86711 Personal history of pulmonary embolism: Secondary | ICD-10-CM | POA: Diagnosis not present

## 2024-05-05 DIAGNOSIS — E041 Nontoxic single thyroid nodule: Secondary | ICD-10-CM | POA: Diagnosis not present

## 2024-05-05 DIAGNOSIS — I10 Essential (primary) hypertension: Secondary | ICD-10-CM | POA: Diagnosis not present

## 2024-05-05 DIAGNOSIS — Z86718 Personal history of other venous thrombosis and embolism: Secondary | ICD-10-CM | POA: Diagnosis not present

## 2024-05-05 DIAGNOSIS — E559 Vitamin D deficiency, unspecified: Secondary | ICD-10-CM | POA: Diagnosis not present

## 2024-05-05 DIAGNOSIS — I739 Peripheral vascular disease, unspecified: Secondary | ICD-10-CM | POA: Diagnosis not present

## 2024-08-20 ENCOUNTER — Ambulatory Visit: Admitting: Internal Medicine

## 2024-08-20 ENCOUNTER — Encounter: Payer: Self-pay | Admitting: Internal Medicine

## 2024-08-20 VITALS — BP 130/80 | HR 93 | Temp 98.9°F | Ht 63.0 in | Wt 148.0 lb

## 2024-08-20 DIAGNOSIS — J4520 Mild intermittent asthma, uncomplicated: Secondary | ICD-10-CM

## 2024-08-20 DIAGNOSIS — J45909 Unspecified asthma, uncomplicated: Secondary | ICD-10-CM

## 2024-08-20 DIAGNOSIS — Z87891 Personal history of nicotine dependence: Secondary | ICD-10-CM

## 2024-08-20 DIAGNOSIS — K219 Gastro-esophageal reflux disease without esophagitis: Secondary | ICD-10-CM

## 2024-08-20 DIAGNOSIS — Z86711 Personal history of pulmonary embolism: Secondary | ICD-10-CM

## 2024-08-20 DIAGNOSIS — Z8701 Personal history of pneumonia (recurrent): Secondary | ICD-10-CM

## 2024-08-20 DIAGNOSIS — J449 Chronic obstructive pulmonary disease, unspecified: Secondary | ICD-10-CM

## 2024-08-20 DIAGNOSIS — J4540 Moderate persistent asthma, uncomplicated: Secondary | ICD-10-CM

## 2024-08-20 DIAGNOSIS — R0602 Shortness of breath: Secondary | ICD-10-CM

## 2024-08-20 LAB — NITRIC OXIDE: Nitric Oxide: 12

## 2024-08-20 MED ORDER — FLUTICASONE-SALMETEROL 230-21 MCG/ACT IN AERO: 2.0000 | INHALATION_SPRAY | Freq: Two times a day (BID) | RESPIRATORY_TRACT | 6 refills | Status: AC

## 2024-08-20 MED ORDER — PREDNISONE 20 MG PO TABS: 40.0000 mg | ORAL_TABLET | Freq: Every day | ORAL | 0 refills | Status: AC

## 2024-08-20 NOTE — Progress Notes (Signed)
 " [facility] Consultation      Date: 08/20/2024,   MRN# [id number] Belinda Perry October 22, 1945    CHIEF COMPLAINT:   Assessment of shortness of breath asthma and COPD   HISTORY OF PRESENT ILLNESS   79 year old pleasant white female seen today for ongoing signs symptoms of acute on chronic bronchitis over the last several months.  Patient is an on and off smoker for the last 60 years Quit tobacco abuse in 2006 Patient has intermittent shortness of breath intermittent wheezing intermittent cough since November  Patient has not been on any type of antibiotics or steroids in the last several months however patient seems to be steroid responsive  Patient has a strong history of asthma with allergic rhinitis Patient has been on Advair for the last 2 years At this time her current dose of Advair does not seem to be helping Patient states that albuterol inhaler is helping more than her Advair   At this time patient is showing signs of mild exacerbation No respiratory distress at this time 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 lots of dust flying around   No evidence of heart failure at this time No evidence or signs of infection at this time No respiratory distress No fevers, chills, nausea, vomiting, diarrhea No evidence of lower extremity edema No evidence hemoptysis  Patient states she has a history of blood clots in her family she Was diagnosed with PE in 2021 after taking the COVID injection Patient has been on anticoagulation since then with Xarelto  Assessment of ASTHMA   Lab Results  Component Value Date   NITRICOXIDE 12 08/20/2024   Elevated exhaled Nitric oxide  testing is NOT  highly consistent with type II inflammation -continue inhalers as prescribed     PAST MEDICAL HISTORY   Past Medical History:  Diagnosis Date   Allergic reaction to inhaled pollen    Allergy    Hyperlipidemia    Hypertension     Osteoarthritis, generalized    Other osteoporosis    Skin cancer, basal cell 1994   face     SURGICAL HISTORY   Past Surgical History:  Procedure Laterality Date   AUGMENTATION MAMMAPLASTY Bilateral    BREAST ENHANCEMENT SURGERY Bilateral 1973   COLONOSCOPY     FOOT SURGERY Bilateral    right 8/12, 11/12 for left. Dr Verta   IR PTA VENOUS EXCEPT DIALYSIS CIRCUIT  12/04/2019   IR RADIOLOGIST EVAL & MGMT  01/27/2020   IR RADIOLOGIST EVAL & MGMT  05/25/2020   IR RADIOLOGIST EVAL & MGMT  05/03/2021   IR RADIOLOGIST EVAL & MGMT  05/05/2022   IR THROMBECT VENO MECH MOD SED  12/04/2019   IR US  GUIDE VASC ACCESS LEFT  12/04/2019   IR US  GUIDE VASC ACCESS RIGHT  12/04/2019   IR VENO/EXT/BI  12/04/2019   IR VENO/EXT/BI  12/04/2019   IR VENOCAVAGRAM IVC  12/04/2019   POLYPECTOMY     TONSILLECTOMY N/A 1970   TUBAL LIGATION       FAMILY HISTORY   Family History  Problem Relation Age of Onset   Heart disease Mother    Heart disease Father    Diabetes Father    Hypertension Father    Stroke Sister    Heart disease Brother    Cancer Maternal Grandfather        colon   Colon cancer Maternal Grandfather 54   Cancer Other        various cancers  Colon cancer Paternal Uncle 52   Colon polyps Neg Hx    Esophageal cancer Neg Hx    Rectal cancer Neg Hx    Stomach cancer Neg Hx      SOCIAL HISTORY   Social History[1]   MEDICATIONS    Home Medication:  Current Outpatient Rx   Order #: [id number] Class: Historical Med   Order #: [id number] Class: Historical Med   Order #: [id number] Class: Historical Med   Order #: [id number] Class: Historical Med   Order #: [id number] Class: Historical Med   Order #: [id number] Class: Historical Med   Order #: [id number] Class: Historical Med   Order #: [id number] Class: Historical Med   Order #: [id number] Class: Historical Med   Order #: [id number] Class: Normal   Order #: [id number] Class: Historical Med   Order #: [id number] Class: Historical Med   Order #:  [id number] Class: Historical Med   Order #: [id number] Class: Historical Med   Order #: [id number] Class: Normal    Current Medication: Current Medications[2]    ALLERGIES   Codeine   BP 130/80   Pulse 93   Temp 98.9 F (37.2 C)   Ht 5' 3 (1.6 m)   Wt 148 lb (67.1 kg)   SpO2 94%   BMI 26.22 kg/m    Review of Systems: Gen:  Denies  fever, sweats, chills weight loss  HEENT: Denies blurred vision, double vision, ear pain, eye pain, hearing loss, nose bleeds, sore throat Cardiac:  No dizziness, chest pain or heaviness, chest tightness,edema, No JVD Resp:   + cough, -sputum production, +shortness of breath,+wheezing, -hemoptysis,  Other:  All other systems negative   Physical Examination:   General Appearance: No distress  EYES PERRLA, EOM intact.   NECK Supple, No JVD Pulmonary: normal breath sounds, No wheezing.  CardiovascularNormal S1,S2.  No m/r/g.   Abdomen: Benign, Soft, non-tender. Neurology UE/LE 5/5 strength, no focal deficits Ext pulses intact, cap refill intact ALL OTHER ROS ARE NEGATIVE      IMAGING    CT chest 2021 Report of mild emphysematous changes  ASSESSMENT/PLAN   79 year old pleasant white female with underlying diagnosis with signs symptoms of asthma and COPD with a previous smoking history with a mild exacerbation at this time due to environmental exposure  #1 increase Advair to 230 2 puffs twice daily rinse mouth after use #2 continue Flonase  and Zyrtec #3 continue GERD therapy #4 continue anticoagulation for history of pulmonary embolism #5 prednisone  20 mg daily for 7 days #6 perform incentive spirometry 5-10 times per day #7 plan for pulmonary function test in about 6 months  Avoid Allergens and Irritants Avoid secondhand smoke Avoid SICK contacts Recommend  Masking  when appropriate Recommend Keep up-to-date with vaccinations   CURRENT MEDICATIONS REVIEWED AT LENGTH WITH PATIENT TODAY   Patient  satisfied with Plan of  action and management. All questions answered   Follow up 4 weeks   I spent a total of 67 minutes minutes dedicated to the care of this patient on the date of this encounter to include pre-visit review of records, face-to-face time with the patient discussing conditions above, post visit ordering of testing, clinical documentation with the electronic health record, making appropriate referrals as documented, and communicating necessary information to the patient's healthcare team.    The Patient requires high complexity decision making for assessment and support, frequent evaluation and titration of therapies, application of advanced monitoring technologies and extensive interpretation of multiple databases.  Patient satisfied with Plan of action and management. All questions answered  Nickolas Alm Cellar, M.D.  [facility]  Medical Director [facility]                 [1]  Social History Tobacco Use   Smoking status: Former    Types: Cigarettes   Smokeless tobacco: Never  Substance Use Topics   Alcohol use: No   Drug use: No  [2]  Current Outpatient Medications:    albuterol (VENTOLIN HFA) 108 (90 Base) MCG/ACT inhaler, Inhale 1-2 puffs into the lungs every 6 (six) hours as needed for wheezing or shortness of breath (or cough)., Disp: , Rfl:    amLODipine (NORVASC) 5 MG tablet, Take 5 mg by mouth daily., Disp: , Rfl:    fluticasone -salmeterol (ADVAIR HFA) 115-21 MCG/ACT inhaler, Inhale 2 puffs into the lungs 2 (two) times daily., Disp: , Rfl:    omeprazole (PRILOSEC) 40 MG capsule, Take 40 mg by mouth daily., Disp: , Rfl:    rivaroxaban (XARELTO) 20 MG TABS tablet, Take 20 mg by mouth daily with supper., Disp: , Rfl:    rosuvastatin (CRESTOR) 5 MG tablet, Take 5 mg by mouth daily., Disp: , Rfl:    telmisartan-hydrochlorothiazide  (MICARDIS HCT) 80-12.5 MG tablet, Take 1 tablet by mouth daily., Disp: , Rfl:    BIOTIN PO, Take 1  tablet by mouth., Disp: , Rfl:    cetirizine (ZYRTEC) 10 MG tablet, Take 10 mg by mouth daily as needed for allergies., Disp: , Rfl:    enoxaparin  (LOVENOX ) 60 MG/0.6ML injection, Inject 0.6 mLs (60 mg total) into the skin every 12 (twelve) hours., Disp: 50.4 mL, Rfl: 0   fluticasone  (FLONASE ) 50 MCG/ACT nasal spray, Place 2 sprays into both nostrils daily as needed for allergies or rhinitis., Disp: , Rfl:    Multiple Vitamins-Minerals (MULTIVITAL) CHEW, Chew 1 tablet by mouth daily., Disp: , Rfl:    potassium chloride (KLOR-CON) 10 MEQ tablet, Take 10 mEq by mouth daily., Disp: , Rfl:    Probiotic Product (PROBIOTIC DAILY PO), Take 1 tablet by mouth., Disp: , Rfl:    simvastatin  (ZOCOR ) 40 MG tablet, TAKE 1 TABLET (40 MG TOTAL) BY MOUTH AT BEDTIME. (Patient taking differently: Take 40 mg by mouth at bedtime. ), Disp: 90 tablet, Rfl: 0  "

## 2024-08-20 NOTE — Patient Instructions (Addendum)
 Recommend obtaining pulmonary function testing to assess lung function in about 3- 6  months Use Incentive Spirometry device 3-5 times a day (think: on commercial breaks)  Increase Advair to 230, 2 puffs in the morning 2 puffs at night Please make sure to rinse mouth out after use  Continue to use albuterol inhaler as needed  Continue Flonase  and Zyrtec as prescribed  Start prednisone  20 mg daily for 7 days  Avoid Allergens and Irritants Avoid secondhand smoke Avoid SICK contacts Recommend  Masking  when appropriate Recommend Keep up-to-date with vaccinations

## 2024-09-25 ENCOUNTER — Ambulatory Visit: Admitting: Internal Medicine

## 2024-11-18 ENCOUNTER — Encounter
